# Patient Record
Sex: Female | Born: 1935 | Race: White | Hispanic: No | Marital: Married | Smoking: Former smoker
Health system: Southern US, Community
[De-identification: ages and names within clinical notes are randomized; demographics above are authoritative.]

## PROBLEM LIST (undated history)

## (undated) DIAGNOSIS — M1990 Unspecified osteoarthritis, unspecified site: Secondary | ICD-10-CM

## (undated) DIAGNOSIS — I10 Essential (primary) hypertension: Secondary | ICD-10-CM

## (undated) DIAGNOSIS — K639 Disease of intestine, unspecified: Secondary | ICD-10-CM

## (undated) DIAGNOSIS — E039 Hypothyroidism, unspecified: Secondary | ICD-10-CM

## (undated) DIAGNOSIS — M47816 Spondylosis without myelopathy or radiculopathy, lumbar region: Secondary | ICD-10-CM

## (undated) DIAGNOSIS — Z860100 Personal history of colon polyps, unspecified: Secondary | ICD-10-CM

## (undated) DIAGNOSIS — J309 Allergic rhinitis, unspecified: Secondary | ICD-10-CM

## (undated) DIAGNOSIS — M5412 Radiculopathy, cervical region: Secondary | ICD-10-CM

## (undated) DIAGNOSIS — M1712 Unilateral primary osteoarthritis, left knee: Secondary | ICD-10-CM

## (undated) DIAGNOSIS — F329 Major depressive disorder, single episode, unspecified: Secondary | ICD-10-CM

## (undated) DIAGNOSIS — M810 Age-related osteoporosis without current pathological fracture: Secondary | ICD-10-CM

## (undated) DIAGNOSIS — K219 Gastro-esophageal reflux disease without esophagitis: Secondary | ICD-10-CM

## (undated) DIAGNOSIS — K589 Irritable bowel syndrome without diarrhea: Secondary | ICD-10-CM

## (undated) DIAGNOSIS — G4700 Insomnia, unspecified: Secondary | ICD-10-CM

## (undated) DIAGNOSIS — K6389 Other specified diseases of intestine: Secondary | ICD-10-CM

## (undated) DIAGNOSIS — M1711 Unilateral primary osteoarthritis, right knee: Secondary | ICD-10-CM

## (undated) DIAGNOSIS — F411 Generalized anxiety disorder: Secondary | ICD-10-CM

## (undated) DIAGNOSIS — F419 Anxiety disorder, unspecified: Secondary | ICD-10-CM

## (undated) DIAGNOSIS — J329 Chronic sinusitis, unspecified: Secondary | ICD-10-CM

## (undated) DIAGNOSIS — F32A Depression, unspecified: Secondary | ICD-10-CM

## (undated) DIAGNOSIS — G43909 Migraine, unspecified, not intractable, without status migrainosus: Secondary | ICD-10-CM

## (undated) DIAGNOSIS — E785 Hyperlipidemia, unspecified: Secondary | ICD-10-CM

## (undated) DIAGNOSIS — Z820 Family history of epilepsy and other diseases of the nervous system: Secondary | ICD-10-CM

## (undated) DIAGNOSIS — K279 Peptic ulcer, site unspecified, unspecified as acute or chronic, without hemorrhage or perforation: Secondary | ICD-10-CM

## (undated) DIAGNOSIS — G5600 Carpal tunnel syndrome, unspecified upper limb: Secondary | ICD-10-CM

## (undated) HISTORY — DX: Unspecified osteoarthritis, unspecified site: M19.90

## (undated) HISTORY — DX: Migraine, unspecified, not intractable, without status migrainosus: G43.909

## (undated) HISTORY — DX: Unilateral primary osteoarthritis, left knee: M17.12

## (undated) HISTORY — DX: Anxiety disorder, unspecified: F41.9

## (undated) HISTORY — PX: APPENDECTOMY: SHX54

## (undated) HISTORY — DX: Personal history of colon polyps, unspecified: Z86.0100

## (undated) HISTORY — DX: Hyperlipidemia, unspecified: E78.5

## (undated) HISTORY — DX: Peptic ulcer, site unspecified, unspecified as acute or chronic, without hemorrhage or perforation: K27.9

## (undated) HISTORY — DX: Radiculopathy, cervical region: M54.12

## (undated) HISTORY — DX: Irritable bowel syndrome, unspecified: K58.9

## (undated) HISTORY — DX: Other specified diseases of intestine: K63.89

## (undated) HISTORY — DX: Generalized anxiety disorder: F41.1

## (undated) HISTORY — DX: Essential (primary) hypertension: I10

## (undated) HISTORY — PX: BREAST BIOPSY: SHX20

## (undated) HISTORY — DX: Family history of epilepsy and other diseases of the nervous system: Z82.0

## (undated) HISTORY — DX: Hypothyroidism, unspecified: E03.9

## (undated) HISTORY — DX: Disease of intestine, unspecified: K63.9

## (undated) HISTORY — DX: Gastro-esophageal reflux disease without esophagitis: K21.9

## (undated) HISTORY — PX: THYROIDECTOMY: SHX17

## (undated) HISTORY — DX: Carpal tunnel syndrome, unspecified upper limb: G56.00

## (undated) HISTORY — DX: Unilateral primary osteoarthritis, right knee: M17.11

## (undated) HISTORY — DX: Depression, unspecified: F32.A

## (undated) HISTORY — DX: Spondylosis without myelopathy or radiculopathy, lumbar region: M47.816

## (undated) HISTORY — PX: ABDOMINAL HYSTERECTOMY: SHX81

## (undated) HISTORY — DX: Allergic rhinitis, unspecified: J30.9

## (undated) HISTORY — DX: Chronic sinusitis, unspecified: J32.9

## (undated) HISTORY — DX: Insomnia, unspecified: G47.00

## (undated) HISTORY — DX: Personal history of colonic polyps: Z86.010

## (undated) HISTORY — DX: Irritable bowel syndrome without diarrhea: K58.9

## (undated) HISTORY — PX: OTHER SURGICAL HISTORY: SHX169

## (undated) HISTORY — PX: TONSILLECTOMY: SUR1361

## (undated) HISTORY — DX: Major depressive disorder, single episode, unspecified: F32.9

## (undated) HISTORY — DX: Age-related osteoporosis without current pathological fracture: M81.0

---

## 1988-03-24 HISTORY — PX: BACK SURGERY: SHX140

## 1998-01-04 ENCOUNTER — Other Ambulatory Visit: Admission: RE | Admit: 1998-01-04 | Discharge: 1998-01-04 | Payer: Self-pay | Admitting: Gynecology

## 1998-06-21 ENCOUNTER — Ambulatory Visit (HOSPITAL_COMMUNITY): Admission: RE | Admit: 1998-06-21 | Discharge: 1998-06-21 | Payer: Self-pay | Admitting: Obstetrics & Gynecology

## 1999-01-17 ENCOUNTER — Other Ambulatory Visit: Admission: RE | Admit: 1999-01-17 | Discharge: 1999-01-17 | Payer: Self-pay | Admitting: Gynecology

## 1999-08-09 ENCOUNTER — Encounter: Payer: Self-pay | Admitting: Internal Medicine

## 1999-09-20 ENCOUNTER — Ambulatory Visit (HOSPITAL_COMMUNITY): Admission: RE | Admit: 1999-09-20 | Discharge: 1999-09-20 | Payer: Self-pay | Admitting: Gastroenterology

## 1999-10-10 ENCOUNTER — Encounter: Admission: RE | Admit: 1999-10-10 | Discharge: 1999-10-10 | Payer: Self-pay | Admitting: Otolaryngology

## 1999-10-10 ENCOUNTER — Encounter: Payer: Self-pay | Admitting: Otolaryngology

## 1999-12-28 ENCOUNTER — Encounter: Admission: RE | Admit: 1999-12-28 | Discharge: 1999-12-28 | Payer: Self-pay | Admitting: *Deleted

## 1999-12-28 ENCOUNTER — Encounter: Payer: Self-pay | Admitting: Allergy and Immunology

## 2000-01-23 ENCOUNTER — Other Ambulatory Visit: Admission: RE | Admit: 2000-01-23 | Discharge: 2000-01-23 | Payer: Self-pay | Admitting: Obstetrics and Gynecology

## 2001-02-05 ENCOUNTER — Other Ambulatory Visit: Admission: RE | Admit: 2001-02-05 | Discharge: 2001-02-05 | Payer: Self-pay | Admitting: Gynecology

## 2001-11-11 ENCOUNTER — Encounter: Admission: RE | Admit: 2001-11-11 | Discharge: 2001-11-11 | Payer: Self-pay | Admitting: Family Medicine

## 2001-11-11 ENCOUNTER — Encounter: Payer: Self-pay | Admitting: Family Medicine

## 2001-11-16 ENCOUNTER — Encounter: Payer: Self-pay | Admitting: Family Medicine

## 2001-11-16 ENCOUNTER — Encounter: Admission: RE | Admit: 2001-11-16 | Discharge: 2001-11-16 | Payer: Self-pay | Admitting: Family Medicine

## 2002-02-10 ENCOUNTER — Other Ambulatory Visit: Admission: RE | Admit: 2002-02-10 | Discharge: 2002-02-10 | Payer: Self-pay | Admitting: Gynecology

## 2003-02-13 ENCOUNTER — Other Ambulatory Visit: Admission: RE | Admit: 2003-02-13 | Discharge: 2003-02-13 | Payer: Self-pay | Admitting: Gynecology

## 2003-12-22 ENCOUNTER — Encounter: Admission: RE | Admit: 2003-12-22 | Discharge: 2003-12-22 | Payer: Self-pay | Admitting: Family Medicine

## 2004-01-03 ENCOUNTER — Encounter (INDEPENDENT_AMBULATORY_CARE_PROVIDER_SITE_OTHER): Payer: Self-pay | Admitting: Specialist

## 2004-01-03 ENCOUNTER — Ambulatory Visit (HOSPITAL_COMMUNITY): Admission: RE | Admit: 2004-01-03 | Discharge: 2004-01-03 | Payer: Self-pay | Admitting: Family Medicine

## 2004-02-14 ENCOUNTER — Other Ambulatory Visit: Admission: RE | Admit: 2004-02-14 | Discharge: 2004-02-14 | Payer: Self-pay | Admitting: Gynecology

## 2004-03-07 ENCOUNTER — Observation Stay (HOSPITAL_COMMUNITY): Admission: RE | Admit: 2004-03-07 | Discharge: 2004-03-08 | Payer: Self-pay | Admitting: Surgery

## 2004-03-07 ENCOUNTER — Encounter (INDEPENDENT_AMBULATORY_CARE_PROVIDER_SITE_OTHER): Payer: Self-pay | Admitting: Specialist

## 2004-03-12 ENCOUNTER — Ambulatory Visit (HOSPITAL_COMMUNITY): Admission: RE | Admit: 2004-03-12 | Discharge: 2004-03-12 | Payer: Self-pay | Admitting: Family Medicine

## 2004-03-24 ENCOUNTER — Encounter: Payer: Self-pay | Admitting: Internal Medicine

## 2004-03-24 LAB — CONVERTED CEMR LAB

## 2004-10-23 ENCOUNTER — Encounter: Payer: Self-pay | Admitting: Internal Medicine

## 2004-11-27 ENCOUNTER — Encounter: Payer: Self-pay | Admitting: Internal Medicine

## 2004-12-05 ENCOUNTER — Encounter: Payer: Self-pay | Admitting: Internal Medicine

## 2005-02-17 ENCOUNTER — Other Ambulatory Visit: Admission: RE | Admit: 2005-02-17 | Discharge: 2005-02-17 | Payer: Self-pay | Admitting: Gynecology

## 2005-03-05 ENCOUNTER — Encounter: Payer: Self-pay | Admitting: Internal Medicine

## 2005-03-06 ENCOUNTER — Encounter: Admission: RE | Admit: 2005-03-06 | Discharge: 2005-03-06 | Payer: Self-pay | Admitting: Gastroenterology

## 2005-03-06 ENCOUNTER — Encounter: Payer: Self-pay | Admitting: Internal Medicine

## 2006-01-20 ENCOUNTER — Ambulatory Visit: Payer: Self-pay | Admitting: Internal Medicine

## 2006-01-20 LAB — CONVERTED CEMR LAB
ALT: 19 units/L (ref 0–40)
Albumin: 3.8 g/dL (ref 3.5–5.2)
Alkaline Phosphatase: 68 units/L (ref 39–117)
BUN: 17 mg/dL (ref 6–23)
Basophils Absolute: 0.1 10*3/uL (ref 0.0–0.1)
CO2: 27 meq/L (ref 19–32)
Cholesterol: 181 mg/dL (ref 0–200)
Crystals: NEGATIVE
Epithelial cells, urine: NEGATIVE /lpf
Free T4: 1.2 ng/dL (ref 0.9–1.8)
Glomerular Filtration Rate, Af Am: 79 mL/min/{1.73_m2}
Glucose, Bld: 90 mg/dL (ref 70–99)
HDL: 54.6 mg/dL (ref >39.0)
Hemoglobin: 13.7 g/dL (ref 12.0–15.0)
Ketones, ur: NEGATIVE mg/dL
Monocytes Relative: 8.8 % (ref 3.0–11.0)
Mucus, UA: NEGATIVE
Platelets: 273 10*3/uL (ref 150–400)
Potassium: 4.1 meq/L (ref 3.5–5.1)
RBC / HPF: NONE SEEN
RDW: 12.8 % (ref 11.5–14.6)
Total Bilirubin: 0.5 mg/dL (ref 0.3–1.2)
Total Protein, Urine: NEGATIVE mg/dL
Total Protein: 7.3 g/dL (ref 6.0–8.3)
WBC: 6.8 10*3/uL (ref 4.5–10.5)

## 2006-02-06 ENCOUNTER — Emergency Department (HOSPITAL_COMMUNITY): Admission: EM | Admit: 2006-02-06 | Discharge: 2006-02-06 | Payer: Self-pay | Admitting: Family Medicine

## 2006-02-19 ENCOUNTER — Other Ambulatory Visit: Admission: RE | Admit: 2006-02-19 | Discharge: 2006-02-19 | Payer: Self-pay | Admitting: Gynecology

## 2006-02-21 LAB — HM COLONOSCOPY

## 2006-04-01 ENCOUNTER — Ambulatory Visit: Payer: Self-pay | Admitting: Internal Medicine

## 2006-06-09 ENCOUNTER — Ambulatory Visit: Payer: Self-pay | Admitting: Internal Medicine

## 2006-12-03 ENCOUNTER — Encounter: Payer: Self-pay | Admitting: Internal Medicine

## 2006-12-03 DIAGNOSIS — Z860100 Personal history of colon polyps, unspecified: Secondary | ICD-10-CM | POA: Insufficient documentation

## 2006-12-03 DIAGNOSIS — E039 Hypothyroidism, unspecified: Secondary | ICD-10-CM | POA: Insufficient documentation

## 2006-12-03 DIAGNOSIS — K279 Peptic ulcer, site unspecified, unspecified as acute or chronic, without hemorrhage or perforation: Secondary | ICD-10-CM | POA: Insufficient documentation

## 2006-12-03 DIAGNOSIS — J309 Allergic rhinitis, unspecified: Secondary | ICD-10-CM | POA: Insufficient documentation

## 2006-12-03 DIAGNOSIS — E785 Hyperlipidemia, unspecified: Secondary | ICD-10-CM

## 2006-12-03 DIAGNOSIS — G43909 Migraine, unspecified, not intractable, without status migrainosus: Secondary | ICD-10-CM

## 2006-12-03 DIAGNOSIS — M1990 Unspecified osteoarthritis, unspecified site: Secondary | ICD-10-CM | POA: Insufficient documentation

## 2006-12-03 DIAGNOSIS — I10 Essential (primary) hypertension: Secondary | ICD-10-CM | POA: Insufficient documentation

## 2006-12-03 HISTORY — DX: Peptic ulcer, site unspecified, unspecified as acute or chronic, without hemorrhage or perforation: K27.9

## 2006-12-03 HISTORY — DX: Personal history of colonic polyps: Z86.010

## 2006-12-03 HISTORY — DX: Allergic rhinitis, unspecified: J30.9

## 2006-12-03 HISTORY — DX: Hyperlipidemia, unspecified: E78.5

## 2006-12-03 HISTORY — DX: Unspecified osteoarthritis, unspecified site: M19.90

## 2006-12-03 HISTORY — DX: Personal history of colon polyps, unspecified: Z86.0100

## 2006-12-03 HISTORY — DX: Migraine, unspecified, not intractable, without status migrainosus: G43.909

## 2006-12-03 HISTORY — DX: Hypothyroidism, unspecified: E03.9

## 2006-12-03 HISTORY — DX: Essential (primary) hypertension: I10

## 2007-02-03 ENCOUNTER — Encounter: Payer: Self-pay | Admitting: Internal Medicine

## 2007-02-22 ENCOUNTER — Other Ambulatory Visit: Admission: RE | Admit: 2007-02-22 | Discharge: 2007-02-22 | Payer: Self-pay | Admitting: Gynecology

## 2007-03-16 ENCOUNTER — Ambulatory Visit: Payer: Self-pay | Admitting: Internal Medicine

## 2007-03-16 DIAGNOSIS — K219 Gastro-esophageal reflux disease without esophagitis: Secondary | ICD-10-CM

## 2007-03-16 DIAGNOSIS — J019 Acute sinusitis, unspecified: Secondary | ICD-10-CM

## 2007-03-16 DIAGNOSIS — M810 Age-related osteoporosis without current pathological fracture: Secondary | ICD-10-CM | POA: Insufficient documentation

## 2007-03-16 HISTORY — DX: Gastro-esophageal reflux disease without esophagitis: K21.9

## 2007-03-31 ENCOUNTER — Encounter: Payer: Self-pay | Admitting: Internal Medicine

## 2007-04-12 ENCOUNTER — Encounter: Payer: Self-pay | Admitting: Internal Medicine

## 2007-05-27 ENCOUNTER — Telehealth (INDEPENDENT_AMBULATORY_CARE_PROVIDER_SITE_OTHER): Payer: Self-pay | Admitting: *Deleted

## 2007-05-31 ENCOUNTER — Encounter: Payer: Self-pay | Admitting: Internal Medicine

## 2007-06-02 ENCOUNTER — Telehealth (INDEPENDENT_AMBULATORY_CARE_PROVIDER_SITE_OTHER): Payer: Self-pay | Admitting: *Deleted

## 2007-06-03 ENCOUNTER — Ambulatory Visit: Payer: Self-pay | Admitting: Internal Medicine

## 2007-06-03 DIAGNOSIS — G5600 Carpal tunnel syndrome, unspecified upper limb: Secondary | ICD-10-CM

## 2007-06-03 HISTORY — DX: Carpal tunnel syndrome, unspecified upper limb: G56.00

## 2007-06-07 ENCOUNTER — Telehealth (INDEPENDENT_AMBULATORY_CARE_PROVIDER_SITE_OTHER): Payer: Self-pay | Admitting: *Deleted

## 2007-08-12 ENCOUNTER — Telehealth (INDEPENDENT_AMBULATORY_CARE_PROVIDER_SITE_OTHER): Payer: Self-pay | Admitting: *Deleted

## 2007-08-18 ENCOUNTER — Telehealth: Payer: Self-pay | Admitting: Internal Medicine

## 2007-08-19 ENCOUNTER — Ambulatory Visit: Payer: Self-pay | Admitting: Internal Medicine

## 2007-08-19 ENCOUNTER — Encounter (INDEPENDENT_AMBULATORY_CARE_PROVIDER_SITE_OTHER): Payer: Self-pay | Admitting: *Deleted

## 2007-08-19 DIAGNOSIS — R1084 Generalized abdominal pain: Secondary | ICD-10-CM

## 2007-09-15 ENCOUNTER — Ambulatory Visit: Payer: Self-pay | Admitting: Gastroenterology

## 2007-09-15 DIAGNOSIS — K589 Irritable bowel syndrome without diarrhea: Secondary | ICD-10-CM

## 2007-09-15 DIAGNOSIS — K625 Hemorrhage of anus and rectum: Secondary | ICD-10-CM

## 2007-09-15 DIAGNOSIS — K921 Melena: Secondary | ICD-10-CM

## 2007-09-15 HISTORY — DX: Irritable bowel syndrome, unspecified: K58.9

## 2007-09-30 ENCOUNTER — Telehealth: Payer: Self-pay | Admitting: Internal Medicine

## 2007-10-12 ENCOUNTER — Ambulatory Visit: Payer: Self-pay | Admitting: Gastroenterology

## 2007-10-12 ENCOUNTER — Encounter: Payer: Self-pay | Admitting: Gastroenterology

## 2007-10-14 ENCOUNTER — Encounter: Payer: Self-pay | Admitting: Gastroenterology

## 2007-10-15 ENCOUNTER — Telehealth (INDEPENDENT_AMBULATORY_CARE_PROVIDER_SITE_OTHER): Payer: Self-pay | Admitting: *Deleted

## 2007-11-09 ENCOUNTER — Encounter: Admission: RE | Admit: 2007-11-09 | Discharge: 2007-11-09 | Payer: Self-pay | Admitting: *Deleted

## 2007-11-10 ENCOUNTER — Ambulatory Visit (HOSPITAL_BASED_OUTPATIENT_CLINIC_OR_DEPARTMENT_OTHER): Admission: RE | Admit: 2007-11-10 | Discharge: 2007-11-10 | Payer: Self-pay | Admitting: *Deleted

## 2007-11-10 ENCOUNTER — Encounter (INDEPENDENT_AMBULATORY_CARE_PROVIDER_SITE_OTHER): Payer: Self-pay | Admitting: *Deleted

## 2007-12-02 ENCOUNTER — Ambulatory Visit: Payer: Self-pay | Admitting: Internal Medicine

## 2007-12-22 ENCOUNTER — Ambulatory Visit: Payer: Self-pay | Admitting: Internal Medicine

## 2008-01-17 ENCOUNTER — Telehealth (INDEPENDENT_AMBULATORY_CARE_PROVIDER_SITE_OTHER): Payer: Self-pay | Admitting: *Deleted

## 2008-02-02 ENCOUNTER — Encounter: Payer: Self-pay | Admitting: Internal Medicine

## 2008-02-22 LAB — CONVERTED CEMR LAB: Pap Smear: NORMAL

## 2008-02-24 ENCOUNTER — Encounter: Payer: Self-pay | Admitting: Gynecology

## 2008-02-24 ENCOUNTER — Ambulatory Visit: Payer: Self-pay | Admitting: Gynecology

## 2008-02-24 ENCOUNTER — Other Ambulatory Visit: Admission: RE | Admit: 2008-02-24 | Discharge: 2008-02-24 | Payer: Self-pay | Admitting: Gynecology

## 2008-03-06 ENCOUNTER — Ambulatory Visit: Payer: Self-pay | Admitting: Internal Medicine

## 2008-05-31 ENCOUNTER — Ambulatory Visit: Payer: Self-pay | Admitting: Internal Medicine

## 2008-05-31 DIAGNOSIS — R10811 Right upper quadrant abdominal tenderness: Secondary | ICD-10-CM

## 2008-05-31 DIAGNOSIS — F411 Generalized anxiety disorder: Secondary | ICD-10-CM

## 2008-05-31 DIAGNOSIS — F3289 Other specified depressive episodes: Secondary | ICD-10-CM

## 2008-05-31 DIAGNOSIS — F329 Major depressive disorder, single episode, unspecified: Secondary | ICD-10-CM

## 2008-05-31 DIAGNOSIS — R079 Chest pain, unspecified: Secondary | ICD-10-CM

## 2008-05-31 HISTORY — DX: Generalized anxiety disorder: F41.1

## 2008-05-31 HISTORY — DX: Major depressive disorder, single episode, unspecified: F32.9

## 2008-05-31 HISTORY — DX: Other specified depressive episodes: F32.89

## 2008-06-01 LAB — CONVERTED CEMR LAB
ALT: 22 units/L (ref 0–35)
AST: 26 units/L (ref 0–37)
Alkaline Phosphatase: 67 units/L (ref 39–117)
BUN: 20 mg/dL (ref 6–23)
Bacteria, UA: NEGATIVE
Basophils Relative: 0.8 % (ref 0.0–3.0)
CO2: 28 meq/L (ref 19–32)
Chloride: 105 meq/L (ref 96–112)
Creatinine, Ser: 0.9 mg/dL (ref 0.4–1.2)
Direct LDL: 121.8 mg/dL
Eosinophils Relative: 2.2 % (ref 0.0–5.0)
Lymphocytes Relative: 33.6 % (ref 12.0–46.0)
Mucus, UA: NEGATIVE
Neutrophils Relative %: 53.9 % (ref 43.0–77.0)
Nitrite: NEGATIVE
Potassium: 4.5 meq/L (ref 3.5–5.1)
RBC: 4.42 M/uL (ref 3.87–5.11)
Specific Gravity, Urine: 1.01 (ref 1.000–1.035)
Squamous Epithelial / LPF: NEGATIVE /lpf
Total Bilirubin: 0.8 mg/dL (ref 0.3–1.2)
Total CHOL/HDL Ratio: 4.3
Urobilinogen, UA: 0.2 (ref 0.0–1.0)
VLDL: 33 mg/dL (ref 0–40)
WBC: 6.5 10*3/uL (ref 4.5–10.5)

## 2008-06-05 ENCOUNTER — Telehealth (INDEPENDENT_AMBULATORY_CARE_PROVIDER_SITE_OTHER): Payer: Self-pay | Admitting: *Deleted

## 2008-06-07 ENCOUNTER — Encounter: Payer: Self-pay | Admitting: Internal Medicine

## 2008-06-07 ENCOUNTER — Encounter: Admission: RE | Admit: 2008-06-07 | Discharge: 2008-06-07 | Payer: Self-pay | Admitting: Internal Medicine

## 2008-06-07 DIAGNOSIS — D376 Neoplasm of uncertain behavior of liver, gallbladder and bile ducts: Secondary | ICD-10-CM

## 2008-06-09 ENCOUNTER — Encounter: Payer: Self-pay | Admitting: Internal Medicine

## 2008-06-11 ENCOUNTER — Encounter: Admission: RE | Admit: 2008-06-11 | Discharge: 2008-06-11 | Payer: Self-pay | Admitting: Internal Medicine

## 2008-06-30 ENCOUNTER — Telehealth (INDEPENDENT_AMBULATORY_CARE_PROVIDER_SITE_OTHER): Payer: Self-pay | Admitting: *Deleted

## 2008-08-07 ENCOUNTER — Ambulatory Visit: Payer: Self-pay | Admitting: Internal Medicine

## 2008-09-12 ENCOUNTER — Telehealth (INDEPENDENT_AMBULATORY_CARE_PROVIDER_SITE_OTHER): Payer: Self-pay | Admitting: *Deleted

## 2008-09-26 ENCOUNTER — Encounter: Payer: Self-pay | Admitting: Internal Medicine

## 2008-10-23 ENCOUNTER — Telehealth (INDEPENDENT_AMBULATORY_CARE_PROVIDER_SITE_OTHER): Payer: Self-pay | Admitting: *Deleted

## 2008-10-24 ENCOUNTER — Ambulatory Visit: Payer: Self-pay | Admitting: Internal Medicine

## 2008-10-24 DIAGNOSIS — N644 Mastodynia: Secondary | ICD-10-CM

## 2008-11-10 ENCOUNTER — Telehealth (INDEPENDENT_AMBULATORY_CARE_PROVIDER_SITE_OTHER): Payer: Self-pay | Admitting: *Deleted

## 2008-11-13 ENCOUNTER — Ambulatory Visit: Payer: Self-pay | Admitting: Internal Medicine

## 2008-11-13 DIAGNOSIS — IMO0002 Reserved for concepts with insufficient information to code with codable children: Secondary | ICD-10-CM | POA: Insufficient documentation

## 2008-11-13 DIAGNOSIS — R21 Rash and other nonspecific skin eruption: Secondary | ICD-10-CM

## 2008-11-13 DIAGNOSIS — M75100 Unspecified rotator cuff tear or rupture of unspecified shoulder, not specified as traumatic: Secondary | ICD-10-CM

## 2008-11-13 DIAGNOSIS — M542 Cervicalgia: Secondary | ICD-10-CM | POA: Insufficient documentation

## 2008-11-20 ENCOUNTER — Encounter: Payer: Self-pay | Admitting: Internal Medicine

## 2008-11-20 LAB — HM MAMMOGRAPHY

## 2008-11-24 ENCOUNTER — Telehealth: Payer: Self-pay | Admitting: Internal Medicine

## 2008-12-20 ENCOUNTER — Ambulatory Visit: Payer: Self-pay | Admitting: Internal Medicine

## 2009-02-26 ENCOUNTER — Other Ambulatory Visit: Admission: RE | Admit: 2009-02-26 | Discharge: 2009-02-26 | Payer: Self-pay | Admitting: Gynecology

## 2009-02-26 ENCOUNTER — Ambulatory Visit: Payer: Self-pay | Admitting: Gynecology

## 2009-03-14 ENCOUNTER — Telehealth: Payer: Self-pay | Admitting: Internal Medicine

## 2009-03-26 ENCOUNTER — Telehealth: Payer: Self-pay | Admitting: Internal Medicine

## 2009-03-27 ENCOUNTER — Ambulatory Visit: Payer: Self-pay | Admitting: Internal Medicine

## 2009-03-29 ENCOUNTER — Telehealth: Payer: Self-pay | Admitting: Internal Medicine

## 2009-04-05 ENCOUNTER — Telehealth: Payer: Self-pay | Admitting: Internal Medicine

## 2009-04-10 ENCOUNTER — Ambulatory Visit: Payer: Self-pay | Admitting: Internal Medicine

## 2009-05-23 ENCOUNTER — Ambulatory Visit: Payer: Self-pay | Admitting: Internal Medicine

## 2009-05-23 LAB — CONVERTED CEMR LAB
Albumin: 3.7 g/dL (ref 3.5–5.2)
Alkaline Phosphatase: 86 units/L (ref 39–117)
Basophils Absolute: 0 10*3/uL (ref 0.0–0.1)
Basophils Relative: 0.6 % (ref 0.0–3.0)
Bilirubin Urine: NEGATIVE
CO2: 27 meq/L (ref 19–32)
Calcium: 9 mg/dL (ref 8.4–10.5)
Chloride: 102 meq/L (ref 96–112)
Eosinophils Absolute: 0.1 10*3/uL (ref 0.0–0.7)
Glucose, Bld: 92 mg/dL (ref 70–99)
HCT: 40.3 % (ref 36.0–46.0)
HDL: 56.3 mg/dL (ref >39.00)
Hemoglobin: 13.1 g/dL (ref 12.0–15.0)
Ketones, ur: NEGATIVE mg/dL
Leukocytes, UA: NEGATIVE
Lymphs Abs: 2 10*3/uL (ref 0.7–4.0)
MCHC: 32.6 g/dL (ref 30.0–36.0)
MCV: 87.7 fL (ref 78.0–100.0)
Monocytes Absolute: 0.6 10*3/uL (ref 0.1–1.0)
Neutro Abs: 3.6 10*3/uL (ref 1.4–7.7)
Nitrite: NEGATIVE
RBC: 4.6 M/uL (ref 3.87–5.11)
RDW: 13.1 % (ref 11.5–14.6)
Sodium: 139 meq/L (ref 135–145)
Specific Gravity, Urine: 1.015 (ref 1.000–1.030)
TSH: 0.5 microintl units/mL (ref 0.35–5.50)
Total CHOL/HDL Ratio: 3
Total Protein, Urine: NEGATIVE mg/dL
Total Protein: 7 g/dL (ref 6.0–8.3)
Triglycerides: 147 mg/dL (ref 0.0–149.0)
pH: 6 (ref 5.0–8.0)

## 2009-05-25 ENCOUNTER — Ambulatory Visit: Payer: Self-pay | Admitting: Internal Medicine

## 2009-09-05 ENCOUNTER — Telehealth: Payer: Self-pay | Admitting: Internal Medicine

## 2009-10-02 ENCOUNTER — Ambulatory Visit: Payer: Self-pay | Admitting: Internal Medicine

## 2009-10-26 ENCOUNTER — Ambulatory Visit: Payer: Self-pay | Admitting: Internal Medicine

## 2009-11-22 ENCOUNTER — Encounter: Payer: Self-pay | Admitting: Internal Medicine

## 2009-12-03 ENCOUNTER — Emergency Department (HOSPITAL_COMMUNITY): Admission: EM | Admit: 2009-12-03 | Discharge: 2009-12-03 | Payer: Self-pay | Admitting: Emergency Medicine

## 2009-12-18 ENCOUNTER — Encounter: Payer: Self-pay | Admitting: Internal Medicine

## 2009-12-18 ENCOUNTER — Ambulatory Visit: Payer: Self-pay | Admitting: Internal Medicine

## 2009-12-18 DIAGNOSIS — M47817 Spondylosis without myelopathy or radiculopathy, lumbosacral region: Secondary | ICD-10-CM

## 2009-12-18 DIAGNOSIS — M47812 Spondylosis without myelopathy or radiculopathy, cervical region: Secondary | ICD-10-CM | POA: Insufficient documentation

## 2009-12-18 DIAGNOSIS — M25469 Effusion, unspecified knee: Secondary | ICD-10-CM

## 2009-12-27 ENCOUNTER — Telehealth (INDEPENDENT_AMBULATORY_CARE_PROVIDER_SITE_OTHER): Payer: Self-pay | Admitting: Radiology

## 2009-12-31 ENCOUNTER — Encounter: Payer: Self-pay | Admitting: Internal Medicine

## 2009-12-31 ENCOUNTER — Encounter (HOSPITAL_COMMUNITY)
Admission: RE | Admit: 2009-12-31 | Discharge: 2010-01-18 | Payer: Self-pay | Source: Home / Self Care | Admitting: Internal Medicine

## 2009-12-31 ENCOUNTER — Ambulatory Visit: Payer: Self-pay

## 2009-12-31 ENCOUNTER — Ambulatory Visit: Payer: Self-pay | Admitting: Internal Medicine

## 2010-01-21 ENCOUNTER — Ambulatory Visit: Payer: Self-pay | Admitting: Gynecology

## 2010-01-24 ENCOUNTER — Ambulatory Visit: Payer: Self-pay | Admitting: Internal Medicine

## 2010-01-25 ENCOUNTER — Telehealth (INDEPENDENT_AMBULATORY_CARE_PROVIDER_SITE_OTHER): Payer: Self-pay | Admitting: *Deleted

## 2010-02-05 ENCOUNTER — Encounter: Payer: Self-pay | Admitting: Internal Medicine

## 2010-02-06 ENCOUNTER — Telehealth: Payer: Self-pay | Admitting: Internal Medicine

## 2010-02-21 ENCOUNTER — Telehealth: Payer: Self-pay | Admitting: Internal Medicine

## 2010-02-21 ENCOUNTER — Encounter: Payer: Self-pay | Admitting: Internal Medicine

## 2010-02-27 ENCOUNTER — Ambulatory Visit: Payer: Self-pay | Admitting: Gynecology

## 2010-02-27 ENCOUNTER — Other Ambulatory Visit
Admission: RE | Admit: 2010-02-27 | Discharge: 2010-02-27 | Payer: Self-pay | Source: Home / Self Care | Admitting: Gynecology

## 2010-03-01 ENCOUNTER — Encounter: Payer: Self-pay | Admitting: Internal Medicine

## 2010-03-04 ENCOUNTER — Telehealth: Payer: Self-pay | Admitting: Internal Medicine

## 2010-03-06 ENCOUNTER — Ambulatory Visit: Payer: Self-pay | Admitting: Internal Medicine

## 2010-03-06 DIAGNOSIS — R062 Wheezing: Secondary | ICD-10-CM

## 2010-03-07 ENCOUNTER — Encounter: Payer: Self-pay | Admitting: Internal Medicine

## 2010-04-13 ENCOUNTER — Encounter: Payer: Self-pay | Admitting: Family Medicine

## 2010-04-21 LAB — CONVERTED CEMR LAB
AST: 23 units/L (ref 0–37)
Bilirubin, Direct: 0.1 mg/dL (ref 0.0–0.3)
Chloride: 106 meq/L (ref 96–112)
Direct LDL: 129.7 mg/dL
Eosinophils Absolute: 0.1 10*3/uL (ref 0.0–0.6)
Eosinophils Relative: 2.6 % (ref 0.0–5.0)
GFR calc non Af Amer: 58 mL/min
Glucose, Bld: 100 mg/dL — ABNORMAL HIGH (ref 70–99)
HCT: 41.1 % (ref 36.0–46.0)
Leukocytes, UA: NEGATIVE
Lymphocytes Relative: 35.8 % (ref 12.0–46.0)
MCV: 87.5 fL (ref 78.0–100.0)
Neutro Abs: 3.1 10*3/uL (ref 1.4–7.7)
Neutrophils Relative %: 52.7 % (ref 43.0–77.0)
Nitrite: NEGATIVE
RBC: 4.7 M/uL (ref 3.87–5.11)
Sodium: 141 meq/L (ref 135–145)
TSH: 0.71 microintl units/mL (ref 0.35–5.50)
Total Protein: 7.5 g/dL (ref 6.0–8.3)
Urobilinogen, UA: 0.2 (ref 0.0–1.0)
Vit D, 1,25-Dihydroxy: 32 (ref 30–89)
WBC: 5.7 10*3/uL (ref 4.5–10.5)

## 2010-04-23 NOTE — Assessment & Plan Note (Signed)
Summary: f/u appt/cd   Vital Signs:  Patient profile:   77 year old female Height:      57 inches Weight:      132 pounds BMI:     28.67 O2 Sat:      98 % on Room air Temp:     97.1 degrees F oral Pulse rate:   76 / minute BP sitting:   124 / 90  (left arm) Cuff size:   regular  Vitals Entered ByZella Ball Ewing (March 27, 2009 10:12 AM)  O2 Flow:  Room air CC: yearly followup/Re   Primary Care Provider:  Oliver Barre, MD  CC:  yearly followup/Re.  History of Present Illness: overall doing well, no complaints, has been gagining wt with les sexcercise and would like thyroid checked;  denies speific other hyper or hypo thyroid symptoms,  Pt denies CP, sob, doe, wheezing, orthopnea, pnd, worsening LE edema, palps, dizziness or syncope  Pt denies new neuro symptoms such as headache, facial or extremity weakness   Also with 1 - 2 months increased nasal allergy symtpoms with clear d/c and sneezing;  no eye irritation.  Has not been taking the allegra and flonase lately.  No ST , fever, cough or wheezing.   Not taking the sertraline  -? why  Problems Prior to Update: 1)  Neck Pain  (ICD-723.1) 2)  Subacromial Bursitis, Left  (ICD-726.19) 3)  Rash-nonvesicular  (ICD-782.1) 4)  Breast Pain, Right  (ICD-611.71) 5)  Chest Pain  (ICD-786.50) 6)  Liver Mass  (ICD-235.3) 7)  Abdominal Tenderness, Right Upper Quadrant  (ICD-789.61) 8)  Depression  (ICD-311) 9)  Chest Pain  (ICD-786.50) 10)  Preventive Health Care  (ICD-V70.0) 11)  Anxiety  (ICD-300.00) 12)  Irritable Bowel Syndrome  (ICD-564.1) 13)  Rectal Bleeding  (ICD-569.3) 14)  Blood in Stool  (ICD-578.1) 15)  Abdominal Pain, Generalized  (ICD-789.07) 16)  Carpal Tunnel Syndrome, Bilateral  (ICD-354.0) 17)  Preventive Health Care  (ICD-V70.0) 18)  Osteoporosis  (ICD-733.00) 19)  Gerd  (ICD-530.81) 20)  Sinusitis- Acute-nos  (ICD-461.9) 21)  Hypothyroidism  (ICD-244.9) 22)  Peptic Ulcer Disease  (ICD-533.90) 23)  Osteoarthritis   (ICD-715.90) 24)  Hypertension  (ICD-401.9) 25)  Hyperlipidemia  (ICD-272.4) 26)  Colonic Polyps, Hx of  (ICD-V12.72) 27)  Allergic Rhinitis  (ICD-477.9) 28)  Migraine Headache  (ICD-346.90)  Medications Prior to Update: 1)  Levothyroxine Sodium 75 Mcg  Tabs (Levothyroxine Sodium) .Marland Kitchen.. 1 By Mouth Once Daily 2)  Lipitor 10 Mg  Tabs (Atorvastatin Calcium) .Marland Kitchen.. 1 By Mouth Once Daily 3)  Lisinopril 5 Mg  Tabs (Lisinopril) .Marland Kitchen.. 1 By Mouth Once Daily 4)  Hyoscyamine Sulfate Cr 0.375 Mg  Cp12 (Hyoscyamine Sulfate) .... As Needed 5)  Allegra 180 Mg  Tabs (Fexofenadine Hcl) .Marland Kitchen.. 1 By Mouth Once Daily 6)  Lansoprazole 30 Mg Cpdr (Lansoprazole) .Marland Kitchen.. 1 By Mouth Once Daily 7)  Cyclobenzaprine Hcl 10 Mg  Tabs (Cyclobenzaprine Hcl) .... 1/2 - 1 By Mouth Once Daily As Needed 8)  Calcitonin (Salmon) 200 Unit/act Soln (Calcitonin (Salmon)) .... Use Asd 1 Spray Once Daily Alternating Days 9)  Fluticasone Propionate 50 Mcg/act Susp (Fluticasone Propionate) .... 2 Spray/side Once Daily 10)  Alprazolam 0.5 Mg  Tabs (Alprazolam) .Marland Kitchen.. 1 By Mouth Q 8 Hrs Prn 11)  Zolpidem Tartrate 10 Mg  Tabs (Zolpidem Tartrate) .Marland Kitchen.. 1 By Mouth Once Daily As Needed 12)  Darvocet-N 100 100-650 Mg  Tabs (Propoxyphene N-Apap) .Marland Kitchen.. 1-2 Tabs As Needed  Qid As  Needed Pain 13)  Cephalexin 500 Mg Caps (Cephalexin) .Marland Kitchen.. 1 By Mouth Three Times A Day 14)  Sertraline Hcl 50 Mg Tabs (Sertraline Hcl) .Marland Kitchen.. 1 By Mouth Once Daily 15)  Prednisone 10 Mg Tabs (Prednisone) .... 3po Qd For 3days, Then 2po Qd For 3days, Then 1po Qd For 3days, Then Stop  Current Medications (verified): 1)  Levothyroxine Sodium 75 Mcg  Tabs (Levothyroxine Sodium) .Marland Kitchen.. 1 By Mouth Once Daily 2)  Lipitor 10 Mg  Tabs (Atorvastatin Calcium) .Marland Kitchen.. 1 By Mouth Once Daily 3)  Lisinopril 5 Mg  Tabs (Lisinopril) .Marland Kitchen.. 1 By Mouth Once Daily 4)  Hyoscyamine Sulfate Cr 0.375 Mg  Cp12 (Hyoscyamine Sulfate) .... As Needed 5)  Allegra 180 Mg  Tabs (Fexofenadine Hcl) .Marland Kitchen.. 1 By Mouth Once  Daily 6)  Lansoprazole 30 Mg Cpdr (Lansoprazole) .Marland Kitchen.. 1 By Mouth Once Daily 7)  Cyclobenzaprine Hcl 10 Mg  Tabs (Cyclobenzaprine Hcl) .... 1/2 - 1 By Mouth Once Daily As Needed 8)  Fluticasone Propionate 50 Mcg/act Susp (Fluticasone Propionate) .... 2 Spray/side Once Daily 9)  Alprazolam 0.5 Mg  Tabs (Alprazolam) .Marland Kitchen.. 1 By Mouth Q 8 Hrs Prn 10)  Zolpidem Tartrate 10 Mg  Tabs (Zolpidem Tartrate) .Marland Kitchen.. 1 By Mouth Once Daily As Needed  Allergies (verified): 1)  ! Tetracycline  Past History:  Past Medical History: Last updated: 05/31/2008 Migraine Headaches Allergic rhinitis Colonic polyps, hx of - adenoma - last 7/09 Hyperlipidemia Hypertension Osteoarthritis Peptic ulcer disease Hypothyroidism GERD knee djd bilat left > right Osteoporosis Ileocecal valve ulcer IBS Anxiety Depression  Past Surgical History: Last updated: 05/31/2008 Appendectomy- 1979 Hysterectomy- 1979 Thyroidectomy- 02/2004 Tonsillectomy-1937 Breat Biopsy-1970 Back Surgery-1990 s/p right hand surgury/trigger surgury - dr Loletta Parish  Social History: Last updated: 09/15/2007 Married; 1 boy, 2 girls Former Smoker Alcohol use-no Married  Risk Factors: Smoking Status: quit (03/16/2007)  Review of Systems       all otherwise negative per pt -  Physical Exam  General:  alert and overweight-appearing.   Head:  normocephalic and atraumatic.   Eyes:  vision grossly intact, pupils equal, and pupils round.   Ears:  R ear normal and L ear normal.   Nose:  no external deformity and no nasal discharge.   Mouth:  no gingival abnormalities and pharynx pink and moist.   Neck:  supple and no masses.   Lungs:  normal respiratory effort and normal breath sounds.   Heart:  normal rate and regular rhythm.   Extremities:  no edema, no erythema    Impression & Recommendations:  Problem # 1:  HYPERTENSION (ICD-401.9)  Her updated medication list for this problem includes:    Lisinopril 5 Mg Tabs  (Lisinopril) .Marland Kitchen... 1 by mouth once daily  Orders: EKG w/ Interpretation (93000)  BP today: 124/90 Prior BP: 102/62 (11/13/2008)  Labs Reviewed: K+: 4.5 (05/31/2008) Creat: : 0.9 (05/31/2008)   Chol: 212 (05/31/2008)   HDL: 49.5 (05/31/2008)   LDL: DEL (05/31/2008)   TG: 165 (05/31/2008) stable overall by hx and exam, ok to continue meds/tx as is   Problem # 2:  HYPOTHYROIDISM (ICD-244.9)  Her updated medication list for this problem includes:    Levothyroxine Sodium 75 Mcg Tabs (Levothyroxine sodium) .Marland Kitchen... 1 by mouth once daily pt requests TSH today, stable overall by hx and exam, ok to continue meds/tx as is   Orders: TLB-TSH (Thyroid Stimulating Hormone) (84443-TSH)  Problem # 3:  OSTEOPOROSIS (ICD-733.00)  The following medications were removed from the medication list:  Calcitonin (salmon) 200 Unit/act Soln (Calcitonin (salmon)) ..... Use asd 1 spray once daily alternating days pt now off meds at GYN tx of pt - will defer to this  Problem # 4:  ALLERGIC RHINITIS (ICD-477.9)  Her updated medication list for this problem includes:    Allegra 180 Mg Tabs (Fexofenadine hcl) .Marland Kitchen... 1 by mouth once daily    Fluticasone Propionate 50 Mcg/act Susp (Fluticasone propionate) .Marland Kitchen... 2 spray/side once daily urged compliance iwth meds  Complete Medication List: 1)  Levothyroxine Sodium 75 Mcg Tabs (Levothyroxine sodium) .Marland Kitchen.. 1 by mouth once daily 2)  Lipitor 10 Mg Tabs (Atorvastatin calcium) .Marland Kitchen.. 1 by mouth once daily 3)  Lisinopril 5 Mg Tabs (Lisinopril) .Marland Kitchen.. 1 by mouth once daily 4)  Hyoscyamine Sulfate Cr 0.375 Mg Cp12 (Hyoscyamine sulfate) .... As needed 5)  Allegra 180 Mg Tabs (Fexofenadine hcl) .Marland Kitchen.. 1 by mouth once daily 6)  Lansoprazole 30 Mg Cpdr (Lansoprazole) .Marland Kitchen.. 1 by mouth once daily 7)  Cyclobenzaprine Hcl 10 Mg Tabs (Cyclobenzaprine hcl) .... 1/2 - 1 by mouth once daily as needed 8)  Fluticasone Propionate 50 Mcg/act Susp (Fluticasone propionate) .... 2 spray/side once  daily 9)  Alprazolam 0.5 Mg Tabs (Alprazolam) .Marland Kitchen.. 1 by mouth q 8 hrs prn 10)  Zolpidem Tartrate 10 Mg Tabs (Zolpidem tartrate) .Marland Kitchen.. 1 by mouth once daily as needed  Patient Instructions: 1)  Please take all new medications as prescribed - the allergy medications 2)  Continue all previous medications as before this visit  3)  Please go to the Lab in the basement for your blood  tests today 4)  Please schedule a follow-up appointment in march 2011 with CPX labs and : 5)  vit d  v70.0 Prescriptions: FLUTICASONE PROPIONATE 50 MCG/ACT SUSP (FLUTICASONE PROPIONATE) 2 spray/side once daily  #3 x 3   Entered and Authorized by:   Corwin Levins MD   Signed by:   Corwin Levins MD on 03/27/2009   Method used:   Print then Give to Patient   RxID:   [id number] ALLEGRA 180 MG  TABS (FEXOFENADINE HCL) 1 by mouth once daily  #90 x 3   Entered and Authorized by:   Corwin Levins MD   Signed by:   Corwin Levins MD on 03/27/2009   Method used:   Print then Give to Patient   RxID:   [id number]

## 2010-04-23 NOTE — Miscellaneous (Signed)
Summary: Appointment Canceled  Appointment status changed to canceled by [employer] on 12/20/2009 8:26 AM.  Cancellation Comments --------------------- crs/dx:chest pain/wt:134/ins:[facility]/Dr Jonny Ruiz  Appointment Information ----------------------- Appt Type:  CARDIOLOGY NUCLEAR TESTING      Date:  Thursday, December 27, 2009      Time:  8:00 AM for 15 min   Urgency:  Routine   Made By:  Pearson Grippe  To Visit:  [id number]    Reason:  crs/dx:chest pain/wt:134/ins:[facility]/Dr john  Appt Comments ------------- -- 12/20/09 8:26: ([facility]) CANCELED -- crs/dx:chest pain/wt:134/ins:[facility]/Dr john -- 12/19/09 16:35: ([facility]) BOOKED -- Routine CARDIOLOGY NUCLEAR TESTING at 12/27/2009 8:00 AM for 15 min crs/dx:chest pain/wt:134/ins:[facility]/Dr Jonny Ruiz

## 2010-04-23 NOTE — Progress Notes (Signed)
  Phone Note Refill Request  on March 26, 2009 3:58 PM  Refills Requested: Medication #1:  CYCLOBENZAPRINE HCL 10 MG  TABS 1/2 - 1 by mouth once daily as needed   Dosage confirmed as above?Dosage Confirmed   Notes: [facility] Initial call taken by: Scharlene Gloss,  March 26, 2009 3:59 PM    Prescriptions: CYCLOBENZAPRINE HCL 10 MG  TABS (CYCLOBENZAPRINE HCL) 1/2 - 1 by mouth once daily as needed  #90 x 1   Entered by:   Scharlene Gloss   Authorized by:   Corwin Levins MD   Signed by:   Scharlene Gloss on 03/26/2009   Method used:   Faxed to ...       [facility] (mail-order)       [id number]       [place], [place]  [id number]       Ph: [phone]       Fax: [phone]   RxID:   [id number]

## 2010-04-23 NOTE — Progress Notes (Signed)
  Phone Note Refill Request Message from:  Fax from Pharmacy on February 06, 2010 10:20 AM  Refills Requested: Medication #1:  LEVOTHYROXINE SODIUM 75 MCG  TABS 1 by mouth once daily   Dosage confirmed as above?Dosage Confirmed   Last Refilled: 03/14/2009   Notes: [facility]  Medication #2:  LISINOPRIL 5 MG  TABS 1 by mouth once daily   Dosage confirmed as above?Dosage Confirmed   Last Refilled: 03/04/2009   Notes: [facility]  Medication #3:  CYCLOBENZAPRINE HCL 10 MG  TABS 1/2 - 1 by mouth once daily as needed   Dosage confirmed as above?Dosage Confirmed   Last Refilled: 03/26/2009   Notes: [facility] Initial call taken by: Robin Ewing CMA (AAMA),  February 06, 2010 10:21 AM    Prescriptions: CYCLOBENZAPRINE HCL 10 MG  TABS (CYCLOBENZAPRINE HCL) 1/2 - 1 by mouth once daily as needed  #90 x 1   Entered by:   Scharlene Gloss CMA (AAMA)   Authorized by:   Corwin Levins MD   Signed by:   Scharlene Gloss CMA (AAMA) on 02/06/2010   Method used:   [facility] ...       [facility]             , [place]         Ph:        Fax: [phone]   RxID:   [id number] LISINOPRIL 5 MG  TABS (LISINOPRIL) 1 by mouth once daily  #90 Each x 3   Entered by:   Zella Ball Ewing CMA (AAMA)   Authorized by:   Corwin Levins MD   Signed by:   Scharlene Gloss CMA (AAMA) on 02/06/2010   Method used:   [facility]ed [facility] ...       [facility]             , [place]         Ph:        Fax: [phone]   RxID:   [id number] LEVOTHYROXINE SODIUM 75 MCG  TABS (LEVOTHYROXINE SODIUM) 1 by mouth once daily  #90 Each x 3   Entered by:   Scharlene Gloss CMA (AAMA)   Authorized by:   Corwin Levins MD   Signed by:   Scharlene Gloss CMA (AAMA) on 02/06/2010   Method used:   Faxed to ...       [facility]             , [place]         Ph:        Fax: [phone]   RxID:   [id number]   Appended Document:  LMOPT - dxa c/w osteoporosis -       1)  I will ask stacy to look into copay for prolia  which would be approp for her, if she agrees (can be started soon, or if she wants it could wait to discuss at next appt)  robin - to call pt to inform, and arrange any above - I will ask stacy to look into  Appended Document:  called pt informed of above information. She does want Misty Stanley to proceed with checking her copay and certainly will consider getting prolia. She does plan to do some reading on prolia in the meantime.

## 2010-04-23 NOTE — Progress Notes (Signed)
----   Converted from flag ---- ---- 01/24/2010 5:59 PM, Corwin Levins MD wrote: it has been shown that any dose of Vit E (even the 400's) can be harmful -  I would not cont to take any of this  ---- 01/24/2010 3:37 PM, Zella Ball Ewing CMA (AAMA) wrote: patient wanted to know should she continue to not take Vitamin E? ------------------------------  called pt. and informed of above information.

## 2010-04-23 NOTE — Progress Notes (Signed)
Summary: medication Refill  Phone Note Refill Request Message from:  Pharmacy on September 05, 2009 8:16 AM  Refills Requested: Medication #1:  ZOLPIDEM TARTRATE 10 MG  TABS 1 by mouth once daily as needed   Dosage confirmed as above?Dosage Confirmed   Last Refilled: 04/05/2009   Notes: [facility] fax#[phone] Initial call taken by: Scharlene Gloss,  September 05, 2009 8:17 AM  Follow-up for Phone Call        Rx faxed to pharmacy Follow-up by: Margaret Pyle, CMA,  September 05, 2009 8:42 AM    New/Updated Medications: ZOLPIDEM TARTRATE 10 MG  TABS (ZOLPIDEM TARTRATE) 1 by mouth once daily as needed Prescriptions: ZOLPIDEM TARTRATE 10 MG  TABS (ZOLPIDEM TARTRATE) 1 by mouth once daily as needed  #90 x 1   Entered and Authorized by:   Corwin Levins MD   Signed by:   Corwin Levins MD on 09/05/2009   Method used:   Print then Give to Patient   RxID:   [id number]  done hardcopy to [facility] side B - dahlia Corwin Levins MD  September 05, 2009 8:32 AM
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
## 2010-04-23 NOTE — Assessment & Plan Note (Signed)
Summary: SNEEZING   SORE THROAT  STC   Vital Signs:  Patient profile:   77 year old female Height:      59 inches Weight:      135 pounds BMI:     27.37 O2 Sat:      93 % on Room air Temp:     98.1 degrees F oral Pulse rate:   84 / minute BP sitting:   112 / 72  (left arm) Cuff size:   regular  Vitals Entered By: Zella Ball Ewing CMA Duncan Dull) (January 24, 2010 2:54 PM)  O2 Flow:  Room air CC: Sneezing, chest pain, sore throat and ear pain/RE   Primary Care Provider:  Oliver Barre, MD  CC:  Sneezing, chest pain, and sore throat and ear pain/RE.  History of Present Illness: here for acute - c/o 2-3 days onset facial pain, pressure, fever and greenish d/c, with slight ST but Pt denies CP, worsening sob, doe, wheezing, orthopnea, pnd, worsening LE edema, palps, dizziness or syncope  Pt denies new neuro symptoms such as headache, facial or extremity weakness  Denies worsening depressive symptoms, suicidal ideation, or panic., though cont's to have significant family stressor and seems to constantly worry over her husband's health as he doesnt seem to worry at all, as well as a daughter with children now divorced and depending on her for support with child care occasionaly, and marked financial stressors.  No recent wt loss, night sweats, loss of appetite or other constitutional symptoms   Problems Prior to Update: 1)  Spondylosis, Lumbar  (ICD-721.3) 2)  Spondylosis, Cervical  (ICD-721.0) 3)  Joint Effusion, Left Knee  (ICD-719.06) 4)  Chest Pain  (ICD-786.50) 5)  Neck Pain  (ICD-723.1) 6)  Sinusitis- Acute-nos  (ICD-461.9) 7)  Neck Pain  (ICD-723.1) 8)  Subacromial Bursitis, Left  (ICD-726.19) 9)  Rash-nonvesicular  (ICD-782.1) 10)  Breast Pain, Right  (ICD-611.71) 11)  Chest Pain  (ICD-786.50) 12)  Liver Mass  (ICD-235.3) 13)  Abdominal Tenderness, Right Upper Quadrant  (ICD-789.61) 14)  Depression  (ICD-311) 15)  Chest Pain  (ICD-786.50) 16)  Preventive Health Care  (ICD-V70.0) 17)   Anxiety  (ICD-300.00) 18)  Irritable Bowel Syndrome  (ICD-564.1) 19)  Rectal Bleeding  (ICD-569.3) 20)  Blood in Stool  (ICD-578.1) 21)  Abdominal Pain, Generalized  (ICD-789.07) 22)  Carpal Tunnel Syndrome, Bilateral  (ICD-354.0) 23)  Preventive Health Care  (ICD-V70.0) 24)  Osteoporosis  (ICD-733.00) 25)  Gerd  (ICD-530.81) 26)  Sinusitis- Acute-nos  (ICD-461.9) 27)  Hypothyroidism  (ICD-244.9) 28)  Peptic Ulcer Disease  (ICD-533.90) 29)  Osteoarthritis  (ICD-715.90) 30)  Hypertension  (ICD-401.9) 31)  Hyperlipidemia  (ICD-272.4) 32)  Colonic Polyps, Hx of  (ICD-V12.72) 33)  Allergic Rhinitis  (ICD-477.9) 34)  Migraine Headache  (ICD-346.90)  Medications Prior to Update: 1)  Levothyroxine Sodium 75 Mcg  Tabs (Levothyroxine Sodium) .Marland Kitchen.. 1 By Mouth Once Daily 2)  Lipitor 10 Mg  Tabs (Atorvastatin Calcium) .Marland Kitchen.. 1 By Mouth Once Daily 3)  Lisinopril 5 Mg  Tabs (Lisinopril) .Marland Kitchen.. 1 By Mouth Once Daily 4)  Hyoscyamine Sulfate Cr 0.375 Mg  Cp12 (Hyoscyamine Sulfate) .... As Needed 5)  Allegra 180 Mg  Tabs (Fexofenadine Hcl) .Marland Kitchen.. 1 By Mouth Once Daily 6)  Lansoprazole 30 Mg Cpdr (Lansoprazole) .Marland Kitchen.. 1 By Mouth Once Daily 7)  Cyclobenzaprine Hcl 10 Mg  Tabs (Cyclobenzaprine Hcl) .... 1/2 - 1 By Mouth Once Daily As Needed 8)  Fluticasone Propionate 50 Mcg/act Susp (Fluticasone Propionate) .Marland KitchenMarland KitchenMarland Kitchen  2 Spray/side Once Daily 9)  Alprazolam 0.5 Mg  Tabs (Alprazolam) .Marland Kitchen.. 1 By Mouth Q 8 Hrs Prn 10)  Zolpidem Tartrate 10 Mg  Tabs (Zolpidem Tartrate) .Marland Kitchen.. 1 By Mouth Once Daily As Needed 11)  Prednisone 10 Mg Tabs (Prednisone) .... 3po Qd For 3days, Then 2po Qd For 3days, Then 1po Qd For 3days, Then Stop 12)  Cephalexin 500 Mg Caps (Cephalexin) .Marland Kitchen.. 1 By Mouth Three Times A Day  Current Medications (verified): 1)  Levothyroxine Sodium 75 Mcg  Tabs (Levothyroxine Sodium) .Marland Kitchen.. 1 By Mouth Once Daily 2)  Lipitor 10 Mg  Tabs (Atorvastatin Calcium) .Marland Kitchen.. 1 By Mouth Once Daily 3)  Lisinopril 5 Mg  Tabs  (Lisinopril) .Marland Kitchen.. 1 By Mouth Once Daily 4)  Hyoscyamine Sulfate Cr 0.375 Mg  Cp12 (Hyoscyamine Sulfate) .... As Needed 5)  Allegra 180 Mg  Tabs (Fexofenadine Hcl) .Marland Kitchen.. 1 By Mouth Once Daily 6)  Lansoprazole 30 Mg Cpdr (Lansoprazole) .Marland Kitchen.. 1 By Mouth Once Daily 7)  Cyclobenzaprine Hcl 10 Mg  Tabs (Cyclobenzaprine Hcl) .... 1/2 - 1 By Mouth Once Daily As Needed 8)  Fluticasone Propionate 50 Mcg/act Susp (Fluticasone Propionate) .... 2 Spray/side Once Daily 9)  Alprazolam 0.5 Mg  Tabs (Alprazolam) .Marland Kitchen.. 1 By Mouth Q 8 Hrs Prn 10)  Zolpidem Tartrate 10 Mg  Tabs (Zolpidem Tartrate) .Marland Kitchen.. 1 By Mouth Once Daily As Needed 11)  Levofloxacin 500 Mg Tabs (Levofloxacin) .Marland Kitchen.. 1 By Mouth Once Daily 12)  Fluconazole 150 Mg Tabs (Fluconazole) .Marland Kitchen.. 1po Q 3 Days As Needed  Allergies (verified): 1)  ! Tetracycline  Past History:  Past Medical History: Last updated: 12/18/2009 Migraine Headaches Allergic rhinitis Colonic polyps, hx of - adenoma - last 7/09 Hyperlipidemia Hypertension Osteoarthritis Peptic ulcer disease Hypothyroidism GERD knee djd bilat left > right Osteoporosis Ileocecal valve ulcer IBS Anxiety Depression C-spine spondylosis lumbar spondylosis  Past Surgical History: Last updated: 05/31/2008 Appendectomy- 1979 Hysterectomy- 1979 Thyroidectomy- 02/2004 Tonsillectomy-1937 Breat Biopsy-1970 Back Surgery-1990 s/p right hand surgury/trigger surgury - dr Loletta Parish  Social History: Last updated: 05/25/2009 Married; 1 boy, 2 girls Former Smoker Alcohol use-no Married Drug use-no  Risk Factors: Smoking Status: quit (03/16/2007)  Review of Systems       all otherwise negative per pt -    Physical Exam  General:  alert and overweight-appearing.  , mild ill  Head:  normocephalic and atraumatic.   Eyes:  vision grossly intact, pupils equal, and pupils round.   Ears:  bilat tm's red, sinus tender bilat Nose:  no external deformity and no nasal discharge.   Mouth:   pharyngeal erythema and fair dentition.   Neck:  supple but with mild cervical bilat submandibular LA tender Lungs:  normal respiratory effort and normal breath sounds.   Heart:  normal rate and regular rhythm.   Extremities:  no edema, no erythema  Psych:  not depressed appearing and moderately anxious.     Impression & Recommendations:  Problem # 1:  SINUSITIS- ACUTE-NOS (ICD-461.9)  Her updated medication list for this problem includes:    Fluticasone Propionate 50 Mcg/act Susp (Fluticasone propionate) .Marland Kitchen... 2 spray/side once daily    Levofloxacin 500 Mg Tabs (Levofloxacin) .Marland Kitchen... 1 by mouth once daily treat as above, f/u any worsening signs or symptoms   Problem # 2:  HYPERTENSION (ICD-401.9)  Her updated medication list for this problem includes:    Lisinopril 5 Mg Tabs (Lisinopril) .Marland Kitchen... 1 by mouth once daily  BP today: 112/72 Prior BP: 100/62 (12/18/2009)  Labs Reviewed:  K+: 3.9 (05/23/2009) Creat: : 0.9 (05/23/2009)   Chol: 169 (05/23/2009)   HDL: 56.30 (05/23/2009)   LDL: 83 (05/23/2009)   TG: 147.0 (05/23/2009) stable overall by hx and exam, ok to continue meds/tx as is   Problem # 3:  ANXIETY (ICD-300.00)  Her updated medication list for this problem includes:    Alprazolam 0.5 Mg Tabs (Alprazolam) .Marland Kitchen... 1 by mouth q 8 hrs prn stable overall by hx and exam, ok to continue meds/tx as is , declines further consideration of med such as SSRI  Complete Medication List: 1)  Levothyroxine Sodium 75 Mcg Tabs (Levothyroxine sodium) .Marland Kitchen.. 1 by mouth once daily 2)  Lipitor 10 Mg Tabs (Atorvastatin calcium) .Marland Kitchen.. 1 by mouth once daily 3)  Lisinopril 5 Mg Tabs (Lisinopril) .Marland Kitchen.. 1 by mouth once daily 4)  Hyoscyamine Sulfate Cr 0.375 Mg Cp12 (Hyoscyamine sulfate) .... As needed 5)  Allegra 180 Mg Tabs (Fexofenadine hcl) .Marland Kitchen.. 1 by mouth once daily 6)  Lansoprazole 30 Mg Cpdr (Lansoprazole) .Marland Kitchen.. 1 by mouth once daily 7)  Cyclobenzaprine Hcl 10 Mg Tabs (Cyclobenzaprine hcl) .... 1/2  - 1 by mouth once daily as needed 8)  Fluticasone Propionate 50 Mcg/act Susp (Fluticasone propionate) .... 2 spray/side once daily 9)  Alprazolam 0.5 Mg Tabs (Alprazolam) .Marland Kitchen.. 1 by mouth q 8 hrs prn 10)  Zolpidem Tartrate 10 Mg Tabs (Zolpidem tartrate) .Marland Kitchen.. 1 by mouth once daily as needed 11)  Levofloxacin 500 Mg Tabs (Levofloxacin) .Marland Kitchen.. 1 by mouth once daily 12)  Fluconazole 150 Mg Tabs (Fluconazole) .Marland Kitchen.. 1po q 3 days as needed  Patient Instructions: 1)  Please take all new medications as prescribed 2)  Continue all previous medications as before this visit  3)  Please schedule a follow-up appointment March 2012 with CPX labs Prescriptions: FLUCONAZOLE 150 MG TABS (FLUCONAZOLE) 1po q 3 days as needed  #2 x 1   Entered and Authorized by:   Corwin Levins MD   Signed by:   Corwin Levins MD on 01/24/2010   Method used:   Print then Give to Patient   RxID:   [id number] LEVOFLOXACIN 500 MG TABS (LEVOFLOXACIN) 1 by mouth once daily  #10 x 0   Entered and Authorized by:   Corwin Levins MD   Signed by:   Corwin Levins MD on 01/24/2010   Method used:   Print then Give to Patient   RxID:   [id number]    Orders Added: 1)  Est. Patient Level IV [42595]

## 2010-04-23 NOTE — Progress Notes (Signed)
Summary: nuc pre-procedure  Phone Note Outgoing Call   Call placed by: Domenic Polite, CNMT,  December 27, 2009 3:53 PM Call placed to: Patient Summary of Call: Reviewed information on [facility] Information Sheet (see scanned document for further details).  Spoke with patient.  Initial call taken by: Domenic Polite, CNMT,  December 27, 2009 3:53 PM     Nuclear Med Background Indications for Stress Test: Evaluation for Ischemia     Symptoms: Chest Pain, Chest Pain with Exertion, Chest Pressure, Chest Pressure with Exertion    Nuclear Pre-Procedure Cardiac Risk Factors: History of Smoking, Hypertension, Lipids Height (in): 57

## 2010-04-23 NOTE — Progress Notes (Signed)
Summary: Rx  Phone Note Call from Patient Call back at Home Phone [phone]   Caller: Patient Summary of Call: pt called stating that [facility] says that Rx fro Flexeril #90 was not received.I called [facility] and confirmed that Rx was received. I called pt and left a message informing of this. pt told to call back with any further questions of concerns Initial call taken by: Margaret Pyle, CMA,  March 29, 2009 4:04 PM

## 2010-04-23 NOTE — Assessment & Plan Note (Signed)
Summary: PUFFY EYES/SINUS/NWS   Vital Signs:  Patient profile:   77 year old female Height:      59 inches Weight:      133 pounds BMI:     26.96 O2 Sat:      98 % on Room air Temp:     97.9 degrees F oral Pulse rate:   78 / minute BP sitting:   122 / 82  (left arm) Cuff size:   regular  Vitals Entered By: Zella Ball Ewing CMA Duncan Dull) (October 02, 2009 11:21 AM)  O2 Flow:  Room air CC: Sinus congestion, both eye red/RE   Primary Care Provider:  Oliver Barre, MD  CC:  Sinus congestion and both eye red/RE.  History of Present Illness: here with c/o 1 wk worsening eye and sinus symtpoms despite good complacine with meds including the  flonase and allegra;  c/o bilat eye itching, mild redness and puffiness and clear d/c without pain or fever, as well as marked sinus congestion and some diffictuly breathing through the nose with clear congestion;  no tongue swelling, cough or wheezing.  Pt denies CP, sob, doe, wheezing, orthopnea, pnd, worsening LE edema, palps, dizziness or syncope  Pt denies new neuro symptoms such as headache, facial or extremity weakness  No ST, fever, cough.  Has ongoing anxiety, but denies worsening depressive symtpoms, suicidal ideaiton or panic.  Also with soreness to the bilat lower rib cages and laments long and hard her frustation with being unable to lose signifcant wt though she states her calorie intake is realatively low and she is active at the [facility] such as swim classes.  Problems Prior to Update: 1)  Sinusitis- Acute-nos  (ICD-461.9) 2)  Neck Pain  (ICD-723.1) 3)  Subacromial Bursitis, Left  (ICD-726.19) 4)  Rash-nonvesicular  (ICD-782.1) 5)  Breast Pain, Right  (ICD-611.71) 6)  Chest Pain  (ICD-786.50) 7)  Liver Mass  (ICD-235.3) 8)  Abdominal Tenderness, Right Upper Quadrant  (ICD-789.61) 9)  Depression  (ICD-311) 10)  Chest Pain  (ICD-786.50) 11)  Preventive Health Care  (ICD-V70.0) 12)  Anxiety  (ICD-300.00) 13)  Irritable Bowel Syndrome   (ICD-564.1) 14)  Rectal Bleeding  (ICD-569.3) 15)  Blood in Stool  (ICD-578.1) 16)  Abdominal Pain, Generalized  (ICD-789.07) 17)  Carpal Tunnel Syndrome, Bilateral  (ICD-354.0) 18)  Preventive Health Care  (ICD-V70.0) 19)  Osteoporosis  (ICD-733.00) 20)  Gerd  (ICD-530.81) 21)  Sinusitis- Acute-nos  (ICD-461.9) 22)  Hypothyroidism  (ICD-244.9) 23)  Peptic Ulcer Disease  (ICD-533.90) 24)  Osteoarthritis  (ICD-715.90) 25)  Hypertension  (ICD-401.9) 26)  Hyperlipidemia  (ICD-272.4) 27)  Colonic Polyps, Hx of  (ICD-V12.72) 28)  Allergic Rhinitis  (ICD-477.9) 29)  Migraine Headache  (ICD-346.90)  Medications Prior to Update: 1)  Levothyroxine Sodium 75 Mcg  Tabs (Levothyroxine Sodium) .Marland Kitchen.. 1 By Mouth Once Daily 2)  Lipitor 10 Mg  Tabs (Atorvastatin Calcium) .Marland Kitchen.. 1 By Mouth Once Daily 3)  Lisinopril 5 Mg  Tabs (Lisinopril) .Marland Kitchen.. 1 By Mouth Once Daily 4)  Hyoscyamine Sulfate Cr 0.375 Mg  Cp12 (Hyoscyamine Sulfate) .... As Needed 5)  Allegra 180 Mg  Tabs (Fexofenadine Hcl) .Marland Kitchen.. 1 By Mouth Once Daily 6)  Lansoprazole 30 Mg Cpdr (Lansoprazole) .Marland Kitchen.. 1 By Mouth Once Daily 7)  Cyclobenzaprine Hcl 10 Mg  Tabs (Cyclobenzaprine Hcl) .... 1/2 - 1 By Mouth Once Daily As Needed 8)  Fluticasone Propionate 50 Mcg/act Susp (Fluticasone Propionate) .... 2 Spray/side Once Daily 9)  Alprazolam 0.5 Mg  Tabs (Alprazolam) .Marland KitchenMarland KitchenMarland Kitchen  1 By Mouth Q 8 Hrs Prn 10)  Zolpidem Tartrate 10 Mg  Tabs (Zolpidem Tartrate) .Marland Kitchen.. 1 By Mouth Once Daily As Needed 11)  Hydrocodone-Homatropine 5-1.5 Mg/14ml Syrp (Hydrocodone-Homatropine) .Marland Kitchen.. 1 Tsp By Mouth Q 6 Hrs As Needed Cough  Current Medications (verified): 1)  Levothyroxine Sodium 75 Mcg  Tabs (Levothyroxine Sodium) .Marland Kitchen.. 1 By Mouth Once Daily 2)  Lipitor 10 Mg  Tabs (Atorvastatin Calcium) .Marland Kitchen.. 1 By Mouth Once Daily 3)  Lisinopril 5 Mg  Tabs (Lisinopril) .Marland Kitchen.. 1 By Mouth Once Daily 4)  Hyoscyamine Sulfate Cr 0.375 Mg  Cp12 (Hyoscyamine Sulfate) .... As Needed 5)  Allegra 180 Mg   Tabs (Fexofenadine Hcl) .Marland Kitchen.. 1 By Mouth Once Daily 6)  Lansoprazole 30 Mg Cpdr (Lansoprazole) .Marland Kitchen.. 1 By Mouth Once Daily 7)  Cyclobenzaprine Hcl 10 Mg  Tabs (Cyclobenzaprine Hcl) .... 1/2 - 1 By Mouth Once Daily As Needed 8)  Fluticasone Propionate 50 Mcg/act Susp (Fluticasone Propionate) .... 2 Spray/side Once Daily 9)  Alprazolam 0.5 Mg  Tabs (Alprazolam) .Marland Kitchen.. 1 By Mouth Q 8 Hrs Prn 10)  Zolpidem Tartrate 10 Mg  Tabs (Zolpidem Tartrate) .Marland Kitchen.. 1 By Mouth Once Daily As Needed 11)  Prednisone 10 Mg Tabs (Prednisone) .... 3po Qd For 3days, Then 2po Qd For 3days, Then 1po Qd For 3days, Then Stop  Allergies (verified): 1)  ! Tetracycline  Past History:  Past Medical History: Last updated: 05/31/2008 Migraine Headaches Allergic rhinitis Colonic polyps, hx of - adenoma - last 7/09 Hyperlipidemia Hypertension Osteoarthritis Peptic ulcer disease Hypothyroidism GERD knee djd bilat left > right Osteoporosis Ileocecal valve ulcer IBS Anxiety Depression  Past Surgical History: Last updated: 05/31/2008 Appendectomy- 1979 Hysterectomy- 1979 Thyroidectomy- 02/2004 Tonsillectomy-1937 Breat Biopsy-1970 Back Surgery-1990 s/p right hand surgury/trigger surgury - dr Loletta Parish  Social History: Last updated: 05/25/2009 Married; 1 boy, 2 girls Former Smoker Alcohol use-no Married Drug use-no  Risk Factors: Smoking Status: quit (03/16/2007)  Review of Systems       all otherwise negative per pt -    Physical Exam  General:  alert and overweight-appearing.   Head:  normocephalic and atraumatic.   Eyes:  vision grossly intact, pupils equal, and pupils round.  ; bilat conjucnt midl erythema with clear d/c, eyelids puffy/swollen but nontender Ears:  bilat tm's midl erythema, sinus nontednder Nose:  nasal dischargemucosal pallor and mucosal edema.   Mouth:  pharyngeal erythema and fair dentition.   Neck:  supple and no masses.   Lungs:  normal respiratory effort and normal breath  sounds.   Heart:  normal rate and regular rhythm.   Msk:  has some tender to costal margins ant lower rib cages bilat Extremities:  no edema, no erythema    Impression & Recommendations:  Problem # 1:  ALLERGIC RHINITIS (ICD-477.9)  Her updated medication list for this problem includes:    Allegra 180 Mg Tabs (Fexofenadine hcl) .Marland Kitchen... 1 by mouth once daily    Fluticasone Propionate 50 Mcg/act Susp (Fluticasone propionate) .Marland Kitchen... 2 spray/side once daily  with flare - for depo today, and prednisone  pack, treat as above, f/u any worsening signs or symptoms   Orders: Depo- Medrol 40mg  (J1030) Depo- Medrol 80mg  (J1040) Admin of Therapeutic Inj  intramuscular or subcutaneous (86578)  Problem # 2:  CHEST PAIN (ICD-786.50) atypical, delcines  ecg, to check cxr, suspect MSK Orders: T-2 View CXR, Same Day (71020.5TC)  Problem # 3:  HYPERTENSION (ICD-401.9)  Her updated medication list for this problem includes:    Lisinopril  5 Mg Tabs (Lisinopril) .Marland Kitchen... 1 by mouth once daily  BP today: 122/82 Prior BP: 116/90 (05/25/2009)  Labs Reviewed: K+: 3.9 (05/23/2009) Creat: : 0.9 (05/23/2009)   Chol: 169 (05/23/2009)   HDL: 56.30 (05/23/2009)   LDL: 83 (05/23/2009)   TG: 147.0 (05/23/2009) stable overall by hx and exam, ok to continue meds/tx as is   Problem # 4:  ANXIETY (ICD-300.00)  Her updated medication list for this problem includes:    Alprazolam 0.5 Mg Tabs (Alprazolam) .Marland Kitchen... 1 by mouth q 8 hrs prn stable overall by hx and exam, ok to continue meds/tx as is   Complete Medication List: 1)  Levothyroxine Sodium 75 Mcg Tabs (Levothyroxine sodium) .Marland Kitchen.. 1 by mouth once daily 2)  Lipitor 10 Mg Tabs (Atorvastatin calcium) .Marland Kitchen.. 1 by mouth once daily 3)  Lisinopril 5 Mg Tabs (Lisinopril) .Marland Kitchen.. 1 by mouth once daily 4)  Hyoscyamine Sulfate Cr 0.375 Mg Cp12 (Hyoscyamine sulfate) .... As needed 5)  Allegra 180 Mg Tabs (Fexofenadine hcl) .Marland Kitchen.. 1 by mouth once daily 6)  Lansoprazole 30 Mg Cpdr  (Lansoprazole) .Marland Kitchen.. 1 by mouth once daily 7)  Cyclobenzaprine Hcl 10 Mg Tabs (Cyclobenzaprine hcl) .... 1/2 - 1 by mouth once daily as needed 8)  Fluticasone Propionate 50 Mcg/act Susp (Fluticasone propionate) .... 2 spray/side once daily 9)  Alprazolam 0.5 Mg Tabs (Alprazolam) .Marland Kitchen.. 1 by mouth q 8 hrs prn 10)  Zolpidem Tartrate 10 Mg Tabs (Zolpidem tartrate) .Marland Kitchen.. 1 by mouth once daily as needed 11)  Prednisone 10 Mg Tabs (Prednisone) .... 3po qd for 3days, then 2po qd for 3days, then 1po qd for 3days, then stop  Patient Instructions: 1)  you had the steroid shot today 2)  Please take all new medications as prescribed - the prednisone 3)  Continue all previous medications as before this visit  4)  Please go to Radiology in the basement level for your X-Ray today  5)  you can also use Zaditor for the eyes (OTC) 6)  Please schedule a follow-up appointment as needed. 7)  please call if you need referral to allergy Prescriptions: PREDNISONE 10 MG TABS (PREDNISONE) 3po qd for 3days, then 2po qd for 3days, then 1po qd for 3days, then stop  #18 x 0   Entered and Authorized by:   Corwin Levins MD   Signed by:   Corwin Levins MD on 10/02/2009   Method used:   Print then Give to Patient   RxID:   [id number]    Medication Administration  Injection # 1:    Medication: Depo- Medrol 40mg     Diagnosis: ALLERGIC RHINITIS (ICD-477.9)    Route: IM    Site: LUOQ gluteus    Exp Date: 06/2012    Lot #: [id number]    Mfr: [employer]    Given by: Zella Ball Ewing CMA (AAMA) (October 02, 2009 12:23 PM)  Injection # 2:    Medication: Depo- Medrol 80mg     Diagnosis: ALLERGIC RHINITIS (ICD-477.9)    Route: IM    Site: LUOQ gluteus    Exp Date: 06/2012    Lot #: [id number]    Mfr: [employer]    Given by: Zella Ball Ewing CMA Duncan Dull) (October 02, 2009 12:23 PM)  Orders Added: 1)  Depo- Medrol 40mg  [J1030] 2)  Depo- Medrol 80mg  [J1040] 3)  Admin of Therapeutic Inj  intramuscular or subcutaneous [96372] 4)  T-2 View CXR,  Same Day [71020.5TC] 5)  Est. Patient Level IV [41324]

## 2010-04-23 NOTE — Medication Information (Signed)
Summary: Insurance Verification Request/[facility]  Insurance Verification Request/[employer]   Imported By: Lester Pendergrass 02/25/2010 08:20:47  _____________________________________________________________________  External Attachment:    Type:   Image     Comment:   External Document

## 2010-04-23 NOTE — Assessment & Plan Note (Signed)
Summary: sneezing,runny nose,choking cough,chills-lb   Vital Signs:  Patient profile:   77 year old female Height:      57 inches Weight:      135.38 pounds BMI:     29.40 O2 Sat:      93 % on Room air Temp:     97.2 degrees F oral Pulse rate:   94 / minute BP sitting:   102 / 58  (left arm) Cuff size:   regular  Vitals Entered By: Zella Ball Ewing CMA (AAMA) (October 26, 2009 4:01 PM)  O2 Flow:  Room air CC: Congestion and sneezing, spot on right leg and arm/RE   Primary Care Provider:  Oliver Barre, MD  CC:  Congestion and sneezing and spot on right leg and arm/RE.  History of Present Illness: here with acute onset facial pain , pressure, fever adn greenish d/c mild to mod for 3 days with chills as well, on top of several weeks sinus congestion with clearish drainage, no fever and no pain;  denies earacheST, cough, headahce but has malase and general weakness as well;  Pt denies CP, sob, doe, wheezing, orthopnea, pnd, worsening LE edema, palps, dizziness or syncope Pt denies new neuro symptoms such as headache, facial or extremity weakness Overall good compliance with meds, good tolerance  Problems Prior to Update: 1)  Sinusitis- Acute-nos  (ICD-461.9) 2)  Neck Pain  (ICD-723.1) 3)  Subacromial Bursitis, Left  (ICD-726.19) 4)  Rash-nonvesicular  (ICD-782.1) 5)  Breast Pain, Right  (ICD-611.71) 6)  Chest Pain  (ICD-786.50) 7)  Liver Mass  (ICD-235.3) 8)  Abdominal Tenderness, Right Upper Quadrant  (ICD-789.61) 9)  Depression  (ICD-311) 10)  Chest Pain  (ICD-786.50) 11)  Preventive Health Care  (ICD-V70.0) 12)  Anxiety  (ICD-300.00) 13)  Irritable Bowel Syndrome  (ICD-564.1) 14)  Rectal Bleeding  (ICD-569.3) 15)  Blood in Stool  (ICD-578.1) 16)  Abdominal Pain, Generalized  (ICD-789.07) 17)  Carpal Tunnel Syndrome, Bilateral  (ICD-354.0) 18)  Preventive Health Care  (ICD-V70.0) 19)  Osteoporosis  (ICD-733.00) 20)  Gerd  (ICD-530.81) 21)  Sinusitis- Acute-nos  (ICD-461.9) 22)   Hypothyroidism  (ICD-244.9) 23)  Peptic Ulcer Disease  (ICD-533.90) 24)  Osteoarthritis  (ICD-715.90) 25)  Hypertension  (ICD-401.9) 26)  Hyperlipidemia  (ICD-272.4) 27)  Colonic Polyps, Hx of  (ICD-V12.72) 28)  Allergic Rhinitis  (ICD-477.9) 29)  Migraine Headache  (ICD-346.90)  Medications Prior to Update: 1)  Levothyroxine Sodium 75 Mcg  Tabs (Levothyroxine Sodium) .Marland Kitchen.. 1 By Mouth Once Daily 2)  Lipitor 10 Mg  Tabs (Atorvastatin Calcium) .Marland Kitchen.. 1 By Mouth Once Daily 3)  Lisinopril 5 Mg  Tabs (Lisinopril) .Marland Kitchen.. 1 By Mouth Once Daily 4)  Hyoscyamine Sulfate Cr 0.375 Mg  Cp12 (Hyoscyamine Sulfate) .... As Needed 5)  Allegra 180 Mg  Tabs (Fexofenadine Hcl) .Marland Kitchen.. 1 By Mouth Once Daily 6)  Lansoprazole 30 Mg Cpdr (Lansoprazole) .Marland Kitchen.. 1 By Mouth Once Daily 7)  Cyclobenzaprine Hcl 10 Mg  Tabs (Cyclobenzaprine Hcl) .... 1/2 - 1 By Mouth Once Daily As Needed 8)  Fluticasone Propionate 50 Mcg/act Susp (Fluticasone Propionate) .... 2 Spray/side Once Daily 9)  Alprazolam 0.5 Mg  Tabs (Alprazolam) .Marland Kitchen.. 1 By Mouth Q 8 Hrs Prn 10)  Zolpidem Tartrate 10 Mg  Tabs (Zolpidem Tartrate) .Marland Kitchen.. 1 By Mouth Once Daily As Needed 11)  Prednisone 10 Mg Tabs (Prednisone) .... 3po Qd For 3days, Then 2po Qd For 3days, Then 1po Qd For 3days, Then Stop  Current Medications (verified): 1)  Levothyroxine Sodium 75 Mcg  Tabs (Levothyroxine Sodium) .Marland Kitchen.. 1 By Mouth Once Daily 2)  Lipitor 10 Mg  Tabs (Atorvastatin Calcium) .Marland Kitchen.. 1 By Mouth Once Daily 3)  Lisinopril 5 Mg  Tabs (Lisinopril) .Marland Kitchen.. 1 By Mouth Once Daily 4)  Hyoscyamine Sulfate Cr 0.375 Mg  Cp12 (Hyoscyamine Sulfate) .... As Needed 5)  Allegra 180 Mg  Tabs (Fexofenadine Hcl) .Marland Kitchen.. 1 By Mouth Once Daily 6)  Lansoprazole 30 Mg Cpdr (Lansoprazole) .Marland Kitchen.. 1 By Mouth Once Daily 7)  Cyclobenzaprine Hcl 10 Mg  Tabs (Cyclobenzaprine Hcl) .... 1/2 - 1 By Mouth Once Daily As Needed 8)  Fluticasone Propionate 50 Mcg/act Susp (Fluticasone Propionate) .... 2 Spray/side Once Daily 9)   Alprazolam 0.5 Mg  Tabs (Alprazolam) .Marland Kitchen.. 1 By Mouth Q 8 Hrs Prn 10)  Zolpidem Tartrate 10 Mg  Tabs (Zolpidem Tartrate) .Marland Kitchen.. 1 By Mouth Once Daily As Needed 11)  Prednisone 10 Mg Tabs (Prednisone) .... 3po Qd For 3days, Then 2po Qd For 3days, Then 1po Qd For 3days, Then Stop 12)  Cephalexin 500 Mg Caps (Cephalexin) .Marland Kitchen.. 1 By Mouth Three Times A Day  Allergies (verified): 1)  ! Tetracycline  Past History:  Past Medical History: Last updated: 05/31/2008 Migraine Headaches Allergic rhinitis Colonic polyps, hx of - adenoma - last 7/09 Hyperlipidemia Hypertension Osteoarthritis Peptic ulcer disease Hypothyroidism GERD knee djd bilat left > right Osteoporosis Ileocecal valve ulcer IBS Anxiety Depression  Past Surgical History: Last updated: 05/31/2008 Appendectomy- 1979 Hysterectomy- 1979 Thyroidectomy- 02/2004 Tonsillectomy-1937 Breat Biopsy-1970 Back Surgery-1990 s/p right hand surgury/trigger surgury - dr Loletta Parish  Social History: Last updated: 05/25/2009 Married; 1 boy, 2 girls Former Smoker Alcohol use-no Married Drug use-no  Risk Factors: Smoking Status: quit (03/16/2007)  Review of Systems       all otherwise negative per pt -    Physical Exam  General:  alert and well-developed.   Head:  normocephalic and atraumatic.   Eyes:  vision grossly intact, pupils equal, and pupils round.   Ears:  bilat tm's red, sinus tender bilat Nose:  nasal dischargemucosal pallor and mucosal edema.   Mouth:  pharyngeal erythema and fair dentition.   Neck:  supple and no masses.   Lungs:  normal respiratory effort and normal breath sounds.   Heart:  normal rate and regular rhythm.   Extremities:  no edema, no erythema    Impression & Recommendations:  Problem # 1:  SINUSITIS- ACUTE-NOS (ICD-461.9)  Her updated medication list for this problem includes:    Fluticasone Propionate 50 Mcg/act Susp (Fluticasone propionate) .Marland Kitchen... 2 spray/side once daily    Cephalexin  500 Mg Caps (Cephalexin) .Marland Kitchen... 1 by mouth three times a day treat as above, f/u any worsening signs or symptoms   Problem # 2:  ALLERGIC RHINITIS (ICD-477.9)  Her updated medication list for this problem includes:    Allegra 180 Mg Tabs (Fexofenadine hcl) .Marland Kitchen... 1 by mouth once daily    Fluticasone Propionate 50 Mcg/act Susp (Fluticasone propionate) .Marland Kitchen... 2 spray/side once daily treat as above, f/u any worsening signs or symptoms   Problem # 3:  HYPERTENSION (ICD-401.9)  Her updated medication list for this problem includes:    Lisinopril 5 Mg Tabs (Lisinopril) .Marland Kitchen... 1 by mouth once daily  BP today: 102/58 Prior BP: 122/82 (10/02/2009)  Labs Reviewed: K+: 3.9 (05/23/2009) Creat: : 0.9 (05/23/2009)   Chol: 169 (05/23/2009)   HDL: 56.30 (05/23/2009)   LDL: 83 (05/23/2009)   TG: 147.0 (05/23/2009) stable overall by hx and exam,  ok to continue meds/tx as is   Complete Medication List: 1)  Levothyroxine Sodium 75 Mcg Tabs (Levothyroxine sodium) .Marland Kitchen.. 1 by mouth once daily 2)  Lipitor 10 Mg Tabs (Atorvastatin calcium) .Marland Kitchen.. 1 by mouth once daily 3)  Lisinopril 5 Mg Tabs (Lisinopril) .Marland Kitchen.. 1 by mouth once daily 4)  Hyoscyamine Sulfate Cr 0.375 Mg Cp12 (Hyoscyamine sulfate) .... As needed 5)  Allegra 180 Mg Tabs (Fexofenadine hcl) .Marland Kitchen.. 1 by mouth once daily 6)  Lansoprazole 30 Mg Cpdr (Lansoprazole) .Marland Kitchen.. 1 by mouth once daily 7)  Cyclobenzaprine Hcl 10 Mg Tabs (Cyclobenzaprine hcl) .... 1/2 - 1 by mouth once daily as needed 8)  Fluticasone Propionate 50 Mcg/act Susp (Fluticasone propionate) .... 2 spray/side once daily 9)  Alprazolam 0.5 Mg Tabs (Alprazolam) .Marland Kitchen.. 1 by mouth q 8 hrs prn 10)  Zolpidem Tartrate 10 Mg Tabs (Zolpidem tartrate) .Marland Kitchen.. 1 by mouth once daily as needed 11)  Prednisone 10 Mg Tabs (Prednisone) .... 3po qd for 3days, then 2po qd for 3days, then 1po qd for 3days, then stop 12)  Cephalexin 500 Mg Caps (Cephalexin) .Marland Kitchen.. 1 by mouth three times a day  Patient Instructions: 1)   Please take all new medications as prescribed 2)  Continue all previous medications as before this visit  3)  Please schedule a follow-up appointment as needed. Prescriptions: CEPHALEXIN 500 MG CAPS (CEPHALEXIN) 1 by mouth three times a day  #30 x 0   Entered and Authorized by:   Corwin Levins MD   Signed by:   Corwin Levins MD on 10/26/2009   Method used:   Electronically to        [facility]  [phone]* (retail)       [address] [place], [place]  [address]       Ph: [phone] or [phone]       Fax: [phone]   RxID:   [id number]

## 2010-04-23 NOTE — Progress Notes (Signed)
Summary: Rx refill  Phone Note Call from Patient Call back at Home Phone [phone]   Caller: Patient Summary of Call: pt left walk-in message requesting 90 day supply of Ambiem. Ok to do 90 day 0 refills to [facility]? Initial call taken by: Margaret Pyle, CMA,  April 05, 2009 1:42 PM  Follow-up for Phone Call        done hardcopy to LIM side B - dahlia  Follow-up by: Corwin Levins MD,  April 05, 2009 2:02 PM  Additional Follow-up for Phone Call Additional follow up Details #1::        faxed to pharmacy, pt informed Additional Follow-up by: Margaret Pyle, CMA,  April 05, 2009 3:29 PM    New/Updated Medications: ZOLPIDEM TARTRATE 10 MG  TABS (ZOLPIDEM TARTRATE) 1 by mouth once daily as needed Prescriptions: ZOLPIDEM TARTRATE 10 MG  TABS (ZOLPIDEM TARTRATE) 1 by mouth once daily as needed  #90 x 1   Entered and Authorized by:   Corwin Levins MD   Signed by:   Corwin Levins MD on 04/05/2009   Method used:   Print then Give to Patient   RxID:   [id number]

## 2010-04-23 NOTE — Assessment & Plan Note (Signed)
Summary: Cardiology Nuclear Testing  Nuclear Med Background Indications for Stress Test: Evaluation for Ischemia    History Comments: No Documented CAD  Symptoms: Chest Pain, Chest Pain with Exertion, Chest Pressure, Chest Pressure with Exertion, DOE, Fatigue, Light-Headedness  Symptoms Comments: Last episode of CP:now, 7-8/10.   Nuclear Pre-Procedure Cardiac Risk Factors: History of Smoking, Hypertension, Lipids Caffeine/Decaff Intake: None NPO After: 6:00 PM Lungs: Clear.  O2 Sat 99% on RA. IV 0.9% NS with Angio Cath: 22g     IV Site: L Antecubital IV Started by: Irean Hong, RN Chest Size (in) 36     Cup Size D     Height (in): 57 Weight (lb): 131 BMI: 28.45  Nuclear Med Study 1 or 2 day study:  1 day     Stress Test Type:  Treadmill/Lexiscan Reading MD:  Dietrich Pates, MD     Referring MD:  Oliver Barre, MD Resting Radionuclide:  Technetium 20m Tetrofosmin     Resting Radionuclide Dose:  11.0 mCi  Stress Radionuclide:  Technetium 64m Tetrofosmin     Stress Radionuclide Dose:  33.0 mCi   Stress Protocol Exercise Time (min):  2:00 min     Max HR:  131 bpm     Predicted Max HR:  143 bpm  Max Systolic BP: 160 mm Hg     Percent Max HR:  91.61 %Rate Pressure Product:  16109  Lexiscan: 0.4 mg   Stress Test Technologist:  Rea College, CMA-N     Nuclear Technologist:  Doyne Keel, CNMT  Rest Procedure  Myocardial perfusion imaging was performed at rest 45 minutes following the intravenous administration of Technetium 33m Tetrofosmin.  Stress Procedure  The patient received IV Lexiscan 0.4 mg over 15-seconds with concurrent low level exercise and then Technetium 43m Tetrofosmin was injected at 30-seconds.  There were nonspecific ST-T wave changes and occasional PAC's with Lexiscan.  She did c/o chest and throat pressure with infusion.  Quantitative spect images were obtained after a 45 minute delay.  QPS Raw Data Images:  Stress images were motion corrected.  Soft tissue  (diaphragm, breast) surround heart. Stress Images:  Normal homogeneous uptake in all areas of the myocardium. Rest Images:  Normal homogeneous uptake in all areas of the myocardium. Subtraction (SDS):  No evidence of ischemia. Transient Ischemic Dilatation:  1.17  (Normal <1.22)  Lung/Heart Ratio:  0.32  (Normal <0.45)  Quantitative Gated Spect Images QGS EDV:  33 ml QGS ESV:  4 ml QGS EF:  88 %   Overall Impression  Exercise Capacity: Lexiscan with low level exercise BP Response: Normal blood pressure response. Clinical Symptoms: Chest pressure. ECG Impression: No significant ST segment change suggestive of ischemia. Overall Impression: Normal stress nuclear study.  Appended Document: Cardiology Nuclear Testing LMOPT - labs negative, normal, or stable  - No Acute problem   Appended Document: Cardiology Nuclear Testing Pt informed

## 2010-04-23 NOTE — Assessment & Plan Note (Signed)
Summary: 2 MO ROV /NWS  #   Vital Signs:  Patient profile:   77 year old female Height:      59 inches Weight:      134.50 pounds BMI:     27.26 O2 Sat:      93 % on Room air Temp:     97.6 degrees F oral Pulse rate:   87 / minute BP sitting:   116 / 90  (left arm) Cuff size:   regular  Vitals Entered ByZella Ball Ewing (May 25, 2009 8:19 AM)  O2 Flow:  Room air  Preventive Care Screening  Mammogram:    Date:  11/20/2008    Results:  normal   CC: 2 Mo ROV/RE   Primary Care Provider:  Oliver Barre, MD  CC:  2 Mo ROV/RE.  History of Present Illness: overall doing well, Pt denies CP, sob, doe, wheezing, orthopnea, pnd, worsening LE edema, palps, dizziness or syncope  Pt denies new neuro symptoms such as headache, facial or extremity weakness    Preventive Screening-Counseling & Management      Drug Use:  no.    Problems Prior to Update: 1)  Sinusitis- Acute-nos  (ICD-461.9) 2)  Neck Pain  (ICD-723.1) 3)  Subacromial Bursitis, Left  (ICD-726.19) 4)  Rash-nonvesicular  (ICD-782.1) 5)  Breast Pain, Right  (ICD-611.71) 6)  Chest Pain  (ICD-786.50) 7)  Liver Mass  (ICD-235.3) 8)  Abdominal Tenderness, Right Upper Quadrant  (ICD-789.61) 9)  Depression  (ICD-311) 10)  Chest Pain  (ICD-786.50) 11)  Preventive Health Care  (ICD-V70.0) 12)  Anxiety  (ICD-300.00) 13)  Irritable Bowel Syndrome  (ICD-564.1) 14)  Rectal Bleeding  (ICD-569.3) 15)  Blood in Stool  (ICD-578.1) 16)  Abdominal Pain, Generalized  (ICD-789.07) 17)  Carpal Tunnel Syndrome, Bilateral  (ICD-354.0) 18)  Preventive Health Care  (ICD-V70.0) 19)  Osteoporosis  (ICD-733.00) 20)  Gerd  (ICD-530.81) 21)  Sinusitis- Acute-nos  (ICD-461.9) 22)  Hypothyroidism  (ICD-244.9) 23)  Peptic Ulcer Disease  (ICD-533.90) 24)  Osteoarthritis  (ICD-715.90) 25)  Hypertension  (ICD-401.9) 26)  Hyperlipidemia  (ICD-272.4) 27)  Colonic Polyps, Hx of  (ICD-V12.72) 28)  Allergic Rhinitis  (ICD-477.9) 29)  Migraine  Headache  (ICD-346.90)  Medications Prior to Update: 1)  Levothyroxine Sodium 75 Mcg  Tabs (Levothyroxine Sodium) .Marland Kitchen.. 1 By Mouth Once Daily 2)  Lipitor 10 Mg  Tabs (Atorvastatin Calcium) .Marland Kitchen.. 1 By Mouth Once Daily 3)  Lisinopril 5 Mg  Tabs (Lisinopril) .Marland Kitchen.. 1 By Mouth Once Daily 4)  Hyoscyamine Sulfate Cr 0.375 Mg  Cp12 (Hyoscyamine Sulfate) .... As Needed 5)  Allegra 180 Mg  Tabs (Fexofenadine Hcl) .Marland Kitchen.. 1 By Mouth Once Daily 6)  Lansoprazole 30 Mg Cpdr (Lansoprazole) .Marland Kitchen.. 1 By Mouth Once Daily 7)  Cyclobenzaprine Hcl 10 Mg  Tabs (Cyclobenzaprine Hcl) .... 1/2 - 1 By Mouth Once Daily As Needed 8)  Fluticasone Propionate 50 Mcg/act Susp (Fluticasone Propionate) .... 2 Spray/side Once Daily 9)  Alprazolam 0.5 Mg  Tabs (Alprazolam) .Marland Kitchen.. 1 By Mouth Q 8 Hrs Prn 10)  Zolpidem Tartrate 10 Mg  Tabs (Zolpidem Tartrate) .Marland Kitchen.. 1 By Mouth Once Daily As Needed 11)  Cephalexin 500 Mg Caps (Cephalexin) .Marland Kitchen.. 1 By Mouth Three Times A Day 12)  Prednisone 10 Mg Tabs (Prednisone) .... 3po Qd For 3days, Then 2po Qd For 3days, Then 1po Qd For 3days, Then Stop 13)  Hydrocodone-Homatropine 5-1.5 Mg/6ml Syrp (Hydrocodone-Homatropine) .Marland Kitchen.. 1 Tsp By Mouth Q 6 Hrs As Needed Cough  Current Medications (verified): 1)  Levothyroxine Sodium 75 Mcg  Tabs (Levothyroxine Sodium) .Marland Kitchen.. 1 By Mouth Once Daily 2)  Lipitor 10 Mg  Tabs (Atorvastatin Calcium) .Marland Kitchen.. 1 By Mouth Once Daily 3)  Lisinopril 5 Mg  Tabs (Lisinopril) .Marland Kitchen.. 1 By Mouth Once Daily 4)  Hyoscyamine Sulfate Cr 0.375 Mg  Cp12 (Hyoscyamine Sulfate) .... As Needed 5)  Allegra 180 Mg  Tabs (Fexofenadine Hcl) .Marland Kitchen.. 1 By Mouth Once Daily 6)  Lansoprazole 30 Mg Cpdr (Lansoprazole) .Marland Kitchen.. 1 By Mouth Once Daily 7)  Cyclobenzaprine Hcl 10 Mg  Tabs (Cyclobenzaprine Hcl) .... 1/2 - 1 By Mouth Once Daily As Needed 8)  Fluticasone Propionate 50 Mcg/act Susp (Fluticasone Propionate) .... 2 Spray/side Once Daily 9)  Alprazolam 0.5 Mg  Tabs (Alprazolam) .Marland Kitchen.. 1 By Mouth Q 8 Hrs Prn 10)   Zolpidem Tartrate 10 Mg  Tabs (Zolpidem Tartrate) .Marland Kitchen.. 1 By Mouth Once Daily As Needed 11)  Hydrocodone-Homatropine 5-1.5 Mg/13ml Syrp (Hydrocodone-Homatropine) .Marland Kitchen.. 1 Tsp By Mouth Q 6 Hrs As Needed Cough  Allergies (verified): 1)  ! Tetracycline  Past History:  Past Medical History: Last updated: 05/31/2008 Migraine Headaches Allergic rhinitis Colonic polyps, hx of - adenoma - last 7/09 Hyperlipidemia Hypertension Osteoarthritis Peptic ulcer disease Hypothyroidism GERD knee djd bilat left > right Osteoporosis Ileocecal valve ulcer IBS Anxiety Depression  Past Surgical History: Last updated: 05/31/2008 Appendectomy- 1979 Hysterectomy- 1979 Thyroidectomy- 02/2004 Tonsillectomy-1937 Breat Biopsy-1970 Back Surgery-1990 s/p right hand surgury/trigger surgury - dr Loletta Parish  Family History: Last updated: 03/16/2007 sister with leukemia mother with dementia  Social History: Last updated: 05/25/2009 Married; 1 boy, 2 girls Former Smoker Alcohol use-no Married Drug use-no  Risk Factors: Smoking Status: quit (03/16/2007)  Social History: Married; 1 boy, 2 girls Former Smoker Alcohol use-no Married Drug use-no Drug Use:  no  Review of Systems  The patient denies anorexia, fever, vision loss, decreased hearing, hoarseness, chest pain, syncope, dyspnea on exertion, peripheral edema, prolonged cough, headaches, hemoptysis, abdominal pain, melena, hematochezia, severe indigestion/heartburn, hematuria, incontinence, muscle weakness, suspicious skin lesions, transient blindness, difficulty walking, unusual weight change, abnormal bleeding, enlarged lymph nodes, and angioedema.         all otherwise negative per pt  Physical Exam  General:  alert and overweight-appearing.   Head:  normocephalic and atraumatic.   Eyes:  vision grossly intact, pupils equal, and pupils round.   Ears:  R ear normal and L ear normal.   Nose:  no external deformity and no nasal  discharge.   Mouth:  no gingival abnormalities and pharynx pink and moist.   Neck:  supple and no masses.   Lungs:  normal respiratory effort and normal breath sounds.   Heart:  normal rate and regular rhythm.   Abdomen:  soft, non-tender, and normal bowel sounds.   Msk:  no joint tenderness and no joint swelling.   Extremities:  no edema, no erythema  Neurologic:  cranial nerves II-XII intact and strength normal in all extremities.   Psych:  not depressed appearing and moderately anxious.     Impression & Recommendations:  Problem # 1:  Preventive Health Care (ICD-V70.0) Overall doing well, age appropriate education and counseling updated and referral for appropriate preventive services done unless declined, immunizations up to date or declined, diet counseling done if overweight, urged to quit smoking if smokes , most recent labs reviewed and current ordered if appropriate, ecg reviewed or declined (interpretation per ECG scanned in the EMR if done); information regarding Medicare Prevention requirements given  if appropriate   Problem # 2:  HYPERTENSION (ICD-401.9)  Her updated medication list for this problem includes:    Lisinopril 5 Mg Tabs (Lisinopril) .Marland Kitchen... 1 by mouth once daily  BP today: 116/90 Prior BP: 110/80 (04/10/2009)  Labs Reviewed: K+: 3.9 (05/23/2009) Creat: : 0.9 (05/23/2009)   Chol: 169 (05/23/2009)   HDL: 56.30 (05/23/2009)   LDL: 83 (05/23/2009)   TG: 147.0 (05/23/2009) stable overall by hx and exam, ok to continue meds/tx as is   Complete Medication List: 1)  Levothyroxine Sodium 75 Mcg Tabs (Levothyroxine sodium) .Marland Kitchen.. 1 by mouth once daily 2)  Lipitor 10 Mg Tabs (Atorvastatin calcium) .Marland Kitchen.. 1 by mouth once daily 3)  Lisinopril 5 Mg Tabs (Lisinopril) .Marland Kitchen.. 1 by mouth once daily 4)  Hyoscyamine Sulfate Cr 0.375 Mg Cp12 (Hyoscyamine sulfate) .... As needed 5)  Allegra 180 Mg Tabs (Fexofenadine hcl) .Marland Kitchen.. 1 by mouth once daily 6)  Lansoprazole 30 Mg Cpdr  (Lansoprazole) .Marland Kitchen.. 1 by mouth once daily 7)  Cyclobenzaprine Hcl 10 Mg Tabs (Cyclobenzaprine hcl) .... 1/2 - 1 by mouth once daily as needed 8)  Fluticasone Propionate 50 Mcg/act Susp (Fluticasone propionate) .... 2 spray/side once daily 9)  Alprazolam 0.5 Mg Tabs (Alprazolam) .Marland Kitchen.. 1 by mouth q 8 hrs prn 10)  Zolpidem Tartrate 10 Mg Tabs (Zolpidem tartrate) .Marland Kitchen.. 1 by mouth once daily as needed 11)  Hydrocodone-homatropine 5-1.5 Mg/43ml Syrp (Hydrocodone-homatropine) .Marland Kitchen.. 1 tsp by mouth q 6 hrs as needed cough  Patient Instructions: 1)  Continue all previous medications as before this visit 2)  you are currently up to date with your prescriptions and tests 3)  Please schedule a follow-up appointment in 6 months. Prescriptions: LIPITOR 10 MG  TABS (ATORVASTATIN CALCIUM) 1 by mouth once daily  #30 x 11   Entered and Authorized by:   Corwin Levins MD   Signed by:   Corwin Levins MD on 05/25/2009   Method used:   Electronically to        [facility]  [phone]* (retail)       [address] [place], [place]  [address]       Ph: [phone] or [phone]       Fax: [phone]   RxID:   [id number]

## 2010-04-25 NOTE — Assessment & Plan Note (Signed)
Summary: congestion/nws   Vital Signs:  Patient profile:   77 year old female Height:      60 inches Weight:      133 pounds BMI:     26.07 O2 Sat:      98 % on Room air Temp:     97.7 degrees F oral Pulse rate:   87 / minute BP sitting:   110 / 70  (left arm) Cuff size:   regular  Vitals Entered By: Zella Ball Ewing CMA Duncan Dull) (March 06, 2010 1:29 PM)  O2 Flow:  Room air CC: Congestion, difficulty swallowing/RE   Primary Care Zakhia Seres:  Oliver Barre, MD  CC:  Congestion and difficulty swallowing/RE.  History of Present Illness: here with acute - did better after last visit, but today here with c/o 3 days onset facial pain, pressure, fever and greenish d/c, with slight ST, adn non prod cough with mild wheezing today,  also on top of several wks worsening nasal allergy symtpoms, as well as overt reflux without dysphagia, n/v, abd pain, bowel change or blood, but med review reveals she has not been taking her meds for this in the past month.  Has been using freiends leftover nexium a few times per wk which helps.  Pt denies CP, worsening sob, doe, orthopnea, pnd, worsening LE edema, palps, dizziness or syncope .  Pt denies new neuro symptoms such as headache, facial or extremity weakness  Pt denies polydipsia, polyuria   Overall good compliance with meds, trying to follow low chol diet, wt stable, little excercise however . Denies worsening depressive symptoms, suicidal ideation, or panic.    Problems Prior to Update: 1)  Wheezing  (ICD-786.07) 2)  Spondylosis, Lumbar  (ICD-721.3) 3)  Spondylosis, Cervical  (ICD-721.0) 4)  Joint Effusion, Left Knee  (ICD-719.06) 5)  Chest Pain  (ICD-786.50) 6)  Neck Pain  (ICD-723.1) 7)  Sinusitis- Acute-nos  (ICD-461.9) 8)  Neck Pain  (ICD-723.1) 9)  Subacromial Bursitis, Left  (ICD-726.19) 10)  Rash-nonvesicular  (ICD-782.1) 11)  Breast Pain, Right  (ICD-611.71) 12)  Chest Pain  (ICD-786.50) 13)  Liver Mass  (ICD-235.3) 14)  Abdominal  Tenderness, Right Upper Quadrant  (ICD-789.61) 15)  Depression  (ICD-311) 16)  Chest Pain  (ICD-786.50) 17)  Preventive Health Care  (ICD-V70.0) 18)  Anxiety  (ICD-300.00) 19)  Irritable Bowel Syndrome  (ICD-564.1) 20)  Rectal Bleeding  (ICD-569.3) 21)  Blood in Stool  (ICD-578.1) 22)  Abdominal Pain, Generalized  (ICD-789.07) 23)  Carpal Tunnel Syndrome, Bilateral  (ICD-354.0) 24)  Preventive Health Care  (ICD-V70.0) 25)  Osteoporosis  (ICD-733.00) 26)  Gerd  (ICD-530.81) 27)  Sinusitis- Acute-nos  (ICD-461.9) 28)  Hypothyroidism  (ICD-244.9) 29)  Peptic Ulcer Disease  (ICD-533.90) 30)  Osteoarthritis  (ICD-715.90) 31)  Hypertension  (ICD-401.9) 32)  Hyperlipidemia  (ICD-272.4) 33)  Colonic Polyps, Hx of  (ICD-V12.72) 34)  Allergic Rhinitis  (ICD-477.9) 35)  Migraine Headache  (ICD-346.90)  Medications Prior to Update: 1)  Levothyroxine Sodium 75 Mcg  Tabs (Levothyroxine Sodium) .Marland Kitchen.. 1 By Mouth Once Daily 2)  Lipitor 10 Mg  Tabs (Atorvastatin Calcium) .Marland Kitchen.. 1 By Mouth Once Daily 3)  Lisinopril 5 Mg  Tabs (Lisinopril) .Marland Kitchen.. 1 By Mouth Once Daily 4)  Hyoscyamine Sulfate Cr 0.375 Mg  Cp12 (Hyoscyamine Sulfate) .... As Needed 5)  Allegra 180 Mg  Tabs (Fexofenadine Hcl) .Marland Kitchen.. 1 By Mouth Once Daily 6)  Lansoprazole 30 Mg Cpdr (Lansoprazole) .Marland Kitchen.. 1 By Mouth Once Daily 7)  Cyclobenzaprine Hcl 10 Mg  Tabs (Cyclobenzaprine Hcl) .... 1/2 - 1 By Mouth Once Daily As Needed 8)  Fluticasone Propionate 50 Mcg/act Susp (Fluticasone Propionate) .... 2 Spray/side Once Daily 9)  Alprazolam 0.5 Mg  Tabs (Alprazolam) .Marland Kitchen.. 1 By Mouth Q 8 Hrs Prn 10)  Zolpidem Tartrate 10 Mg  Tabs (Zolpidem Tartrate) .Marland Kitchen.. 1 By Mouth Once Daily As Needed 11)  Levofloxacin 500 Mg Tabs (Levofloxacin) .Marland Kitchen.. 1 By Mouth Once Daily 12)  Fluconazole 150 Mg Tabs (Fluconazole) .Marland Kitchen.. 1po Q 3 Days As Needed  Current Medications (verified): 1)  Levothyroxine Sodium 75 Mcg  Tabs (Levothyroxine Sodium) .Marland Kitchen.. 1 By Mouth Once Daily 2)   Lipitor 10 Mg  Tabs (Atorvastatin Calcium) .Marland Kitchen.. 1 By Mouth Once Daily 3)  Lisinopril 5 Mg  Tabs (Lisinopril) .Marland Kitchen.. 1 By Mouth Once Daily 4)  Hyoscyamine Sulfate Cr 0.375 Mg  Cp12 (Hyoscyamine Sulfate) .... As Needed 5)  Fexofenadine Hcl 180 Mg Tabs (Fexofenadine Hcl) .Marland Kitchen.. 1po Once Daily For Allergies 6)  Lansoprazole 30 Mg Cpdr (Lansoprazole) .Marland Kitchen.. 1 By Mouth Once Daily 7)  Cyclobenzaprine Hcl 10 Mg  Tabs (Cyclobenzaprine Hcl) .... 1/2 - 1 By Mouth Once Daily As Needed 8)  Fluticasone Propionate 50 Mcg/act Susp (Fluticasone Propionate) .... 2 Spray/side Once Daily 9)  Alprazolam 0.5 Mg  Tabs (Alprazolam) .Marland Kitchen.. 1 By Mouth Q 8 Hrs Prn 10)  Zolpidem Tartrate 10 Mg  Tabs (Zolpidem Tartrate) .Marland Kitchen.. 1 By Mouth Once Daily As Needed 11)  Azithromycin 250 Mg Tabs (Azithromycin) .... 2po Qd For 1 Day, Then 1po Qd For 4days, Then Stop 12)  Fluconazole 150 Mg Tabs (Fluconazole) .Marland Kitchen.. 1po Q 3 Days As Needed  Allergies (verified): 1)  ! Tetracycline  Past History:  Past Medical History: Last updated: 12/18/2009 Migraine Headaches Allergic rhinitis Colonic polyps, hx of - adenoma - last 7/09 Hyperlipidemia Hypertension Osteoarthritis Peptic ulcer disease Hypothyroidism GERD knee djd bilat left > right Osteoporosis Ileocecal valve ulcer IBS Anxiety Depression C-spine spondylosis lumbar spondylosis  Past Surgical History: Last updated: 05/31/2008 Appendectomy- 1979 Hysterectomy- 1979 Thyroidectomy- 02/2004 Tonsillectomy-1937 Breat Biopsy-1970 Back Surgery-1990 s/p right hand surgury/trigger surgury - dr Loletta Parish  Social History: Last updated: 05/25/2009 Married; 1 boy, 2 girls Former Smoker Alcohol use-no Married Drug use-no  Risk Factors: Smoking Status: quit (03/16/2007)  Review of Systems       all otherwise negative per pt -    Physical Exam  General:  alert and overweight-appearing.  , mild ill  Head:  normocephalic and atraumatic.   Eyes:  vision grossly intact,  pupils equal, and pupils round.   Ears:  bilat tm's red, sinus tender bilat Nose:  no external deformity and no nasal discharge.   Mouth:  pharyngeal erythema and fair dentition.   Neck:  supple but with mild cervical bilat submandibular LA tender Lungs:  normal respiratory effort, R decreased breath sounds, and L decreased breath sounds.  with few bilat wheeze noted, no rales Heart:  normal rate and regular rhythm.   Extremities:  no edema, no erythema  Psych:  normally interactive and moderately anxious.     Impression & Recommendations:  Problem # 1:  SINUSITIS- ACUTE-NOS (ICD-461.9)  Her updated medication list for this problem includes:    Fluticasone Propionate 50 Mcg/act Susp (Fluticasone propionate) .Marland Kitchen... 2 spray/side once daily    Azithromycin 250 Mg Tabs (Azithromycin) .Marland Kitchen... 2po qd for 1 day, then 1po qd for 4days, then stop treat as above, f/u any worsening signs or symptoms   Problem # 2:  WHEEZING (  ICD-786.07)  mild at best, likely related to above, for depomedrol IM today., ok to hold off on predpack for home  Orders: Depo- Medrol 40mg  (J1030) Depo- Medrol 80mg  (J1040) Admin of Therapeutic Inj  intramuscular or subcutaneous (04540)  Problem # 3:  ALLERGIC RHINITIS (ICD-477.9)  Her updated medication list for this problem includes:    Fexofenadine Hcl 180 Mg Tabs (Fexofenadine hcl) .Marland Kitchen... 1po once daily for allergies    Fluticasone Propionate 50 Mcg/act Susp (Fluticasone propionate) .Marland Kitchen... 2 spray/side once daily to re-start meds; treat as above, f/u any worsening signs or symptoms   Problem # 4:  GERD (ICD-530.81)  Her updated medication list for this problem includes:    Hyoscyamine Sulfate Cr 0.375 Mg Cp12 (Hyoscyamine sulfate) .Marland Kitchen... As needed    Lansoprazole 30 Mg Cpdr (Lansoprazole) .Marland Kitchen... 1 by mouth once daily to re-start med, treat as above, f/u any worsening signs or symptoms   Problem # 5:  OSTEOPOROSIS (ICD-733.00) I think pt should pursue prolia, but  she hesitates due to side effects she read about, and GYn Dr Audie Box suggested he though she might not need it;  pt educated reassured - I think she should go ahead with the tx  Complete Medication List: 1)  Levothyroxine Sodium 75 Mcg Tabs (Levothyroxine sodium) .Marland Kitchen.. 1 by mouth once daily 2)  Lipitor 10 Mg Tabs (Atorvastatin calcium) .Marland Kitchen.. 1 by mouth once daily 3)  Lisinopril 5 Mg Tabs (Lisinopril) .Marland Kitchen.. 1 by mouth once daily 4)  Hyoscyamine Sulfate Cr 0.375 Mg Cp12 (Hyoscyamine sulfate) .... As needed 5)  Fexofenadine Hcl 180 Mg Tabs (Fexofenadine hcl) .Marland Kitchen.. 1po once daily for allergies 6)  Lansoprazole 30 Mg Cpdr (Lansoprazole) .Marland Kitchen.. 1 by mouth once daily 7)  Cyclobenzaprine Hcl 10 Mg Tabs (Cyclobenzaprine hcl) .... 1/2 - 1 by mouth once daily as needed 8)  Fluticasone Propionate 50 Mcg/act Susp (Fluticasone propionate) .... 2 spray/side once daily 9)  Alprazolam 0.5 Mg Tabs (Alprazolam) .Marland Kitchen.. 1 by mouth q 8 hrs prn 10)  Zolpidem Tartrate 10 Mg Tabs (Zolpidem tartrate) .Marland Kitchen.. 1 by mouth once daily as needed 11)  Azithromycin 250 Mg Tabs (Azithromycin) .... 2po qd for 1 day, then 1po qd for 4days, then stop 12)  Fluconazole 150 Mg Tabs (Fluconazole) .Marland Kitchen.. 1po q 3 days as needed  Patient Instructions: 1)  you had the steroid shot today for the mild chest wheezing and allergies 2)  Please take all new medications as prescribed - the antibiotic for the sinus 3)  Please re-start the allegra and nasal spray for the allergies 4)  Continue all previous medications as before this visit  5)  I think the prolia would be fine to start, as the risks we discussed are extremely LOW chance and I have never seen them 6)  Please schedule a follow-up appointment as planned Prescriptions: FLUTICASONE PROPIONATE 50 MCG/ACT SUSP (FLUTICASONE PROPIONATE) 2 spray/side once daily  #1 x 11   Entered and Authorized by:   Corwin Levins MD   Signed by:   Corwin Levins MD on 03/06/2010   Method used:   Print then Give to  Patient   RxID:   [id number] FEXOFENADINE HCL 180 MG TABS (FEXOFENADINE HCL) 1po once daily for allergies  #30 x 11   Entered and Authorized by:   Corwin Levins MD   Signed by:   Corwin Levins MD on 03/06/2010   Method used:   Print then Give to Patient   RxID:   [id number]  LANSOPRAZOLE 30 MG CPDR (LANSOPRAZOLE) 1 by mouth once daily  #90 x 3   Entered and Authorized by:   Corwin Levins MD   Signed by:   Corwin Levins MD on 03/06/2010   Method used:   Print then Give to Patient   RxID:   [id number] AZITHROMYCIN 250 MG TABS (AZITHROMYCIN) 2po qd for 1 day, then 1po qd for 4days, then stop  #6 x 1   Entered and Authorized by:   Corwin Levins MD   Signed by:   Corwin Levins MD on 03/06/2010   Method used:   Print then Give to Patient   RxID:   [id number]    Medication Administration  Injection # 1:    Medication: Depo- Medrol 40mg     Diagnosis: WHEEZING (ICD-786.07)    Route: IM    Site: RUOQ gluteus    Exp Date: 06/2012    Lot #: [id number]    Mfr: [employer]    Comments: Patient received 120mg  Depo-medrol    Patient tolerated injection without complications    Given by: Zella Ball Ewing CMA Duncan Dull) (March 06, 2010 3:05 PM)  Injection # 2:    Medication: Depo- Medrol 80mg     Diagnosis: WHEEZING (ICD-786.07)    Route: IM    Site: RUOQ gluteus    Exp Date: 06/2012    Lot #: [id number]    Mfr: [employer]    Given by: Zella Ball Ewing CMA Duncan Dull) (March 06, 2010 3:05 PM)  Orders Added: 1)  Depo- Medrol 40mg  [J1030] 2)  Depo- Medrol 80mg  [J1040] 3)  Admin of Therapeutic Inj  intramuscular or subcutaneous [96372] 4)  Est. Patient Level IV [95284]

## 2010-04-25 NOTE — Medication Information (Signed)
Summary: Precert for Prolia/[employer]  Precert for Prolia/[employer]   Imported By: Sherian Rein 03/06/2010 09:16:51  _____________________________________________________________________  External Attachment:    Type:   Image     Comment:   External Document

## 2010-04-25 NOTE — Progress Notes (Signed)
Summary: Prolia-approved  Phone Note Outgoing Call   Summary of Call: faxed Prolia ins verification request.............Marland Kitchenwill wait for benefits summary. Initial call taken by: Lanier Prude, [facility]),  February 21, 2010 8:05 AM  Follow-up for Phone Call        requested PA form to be completed. Lanier Prude, [facility]),  March 01, 2010 11:44 AM  PA form signed/faxed....Marland KitchenMarland KitchenLanier Prude, [facility])  March 01, 2010 1:42 PM  Rec approval from [place] that Prolia was approved from 03/01/10-03/23/10  Additional Follow-up for Phone Call Additional follow up Details #1::        rec benefits summary from [employer]. Pt will owe 20%=approx $165  OOP    or 20% plus $35 co pay if given at [facility]. left mess for pt to call back  Additional Follow-up by: Lanier Prude, [facility]),  March 08, 2010 10:41 AM    Additional Follow-up for Phone Call Additional follow up Details #2::    pt informed. She is undecided about whether to proceed with Prolia. I am mailing her additonal info on Prolia and she states she will call us if/when she decides to have inj. Follow-up by: Lanier Prude, [facility]),  March 08, 2010 1:27 PM  Additional Follow-up for Phone Call Additional follow up Details #3:: Details for Additional Follow-up Action Taken: noted Additional Follow-up by: Corwin Levins MD,  March 08, 2010 1:38 PM

## 2010-04-25 NOTE — Medication Information (Signed)
Summary: Approval Prolia/[facility]  Approval Prol[facility]   Imported By: Lester Garrett 03/20/2010 09:19:24  _____________________________________________________________________  External Attachment:    Type:   Image     Comment:   External Document

## 2010-04-25 NOTE — Progress Notes (Signed)
Summary: lipitor  Phone Note Call from Patient Call back at Home Phone [phone] Call back at [phone]   Caller: Patient Reason for Call: Talk to Nurse Summary of Call: unable to afford lipitor.... it is 40.00 for the genric. switch to  something else? pt is switching insurance the first of the year, do we have samples to give her till the first of the year? please advise Initial call taken by: Migdalia Dk,  March 04, 2010 11:17 AM  Follow-up for Phone Call        ok for samples, but I dont think we have any  ok to hold for now until jan 1, to see if can continue the same med; as these types of meds are not easily interchangeable due to risk of side effect and efficacy  Follow-up by: Corwin Levins MD,  March 04, 2010 1:02 PM  Additional Follow-up for Phone Call Additional follow up Details #1::        Pt advised and will wait until Jan 1st to try with new Insurance and if not she will call back. Additional Follow-up by: Margaret Pyle, CMA,  March 04, 2010 4:25 PM

## 2010-05-30 ENCOUNTER — Encounter (INDEPENDENT_AMBULATORY_CARE_PROVIDER_SITE_OTHER): Payer: Self-pay | Admitting: *Deleted

## 2010-05-30 ENCOUNTER — Other Ambulatory Visit: Payer: BLUE CROSS/BLUE SHIELD

## 2010-05-30 ENCOUNTER — Other Ambulatory Visit: Payer: Self-pay | Admitting: Internal Medicine

## 2010-05-30 DIAGNOSIS — Z0000 Encounter for general adult medical examination without abnormal findings: Secondary | ICD-10-CM

## 2010-05-30 DIAGNOSIS — E039 Hypothyroidism, unspecified: Secondary | ICD-10-CM

## 2010-05-30 LAB — URINALYSIS, ROUTINE W REFLEX MICROSCOPIC
Nitrite: NEGATIVE
Specific Gravity, Urine: 1.025 (ref 1.000–1.030)
Total Protein, Urine: NEGATIVE
pH: 5.5 (ref 5.0–8.0)

## 2010-05-30 LAB — CBC WITH DIFFERENTIAL/PLATELET
Basophils Relative: 0.5 % (ref 0.0–3.0)
Eosinophils Relative: 2.8 % (ref 0.0–5.0)
Hemoglobin: 12.4 g/dL (ref 12.0–15.0)
Lymphocytes Relative: 35 % (ref 12.0–46.0)
MCV: 86.3 fl (ref 78.0–100.0)
Neutro Abs: 4.1 10*3/uL (ref 1.4–7.7)
Neutrophils Relative %: 53.7 % (ref 43.0–77.0)
RBC: 4.23 Mil/uL (ref 3.87–5.11)
WBC: 7.7 10*3/uL (ref 4.5–10.5)

## 2010-05-30 LAB — BASIC METABOLIC PANEL
Calcium: 9 mg/dL (ref 8.4–10.5)
Creatinine, Ser: 1 mg/dL (ref 0.4–1.2)
GFR: 60.5 mL/min (ref >60.00)
Sodium: 139 mEq/L (ref 135–145)

## 2010-05-30 LAB — LIPID PANEL
HDL: 54.5 mg/dL (ref >39.00)
LDL Cholesterol: 81 mg/dL (ref 0–99)
Total CHOL/HDL Ratio: 3
Triglycerides: 115 mg/dL (ref 0.0–149.0)

## 2010-05-30 LAB — HEPATIC FUNCTION PANEL
Bilirubin, Direct: 0.1 mg/dL (ref 0.0–0.3)
Total Bilirubin: 0.3 mg/dL (ref 0.3–1.2)

## 2010-06-06 LAB — POCT I-STAT, CHEM 8
Creatinine, Ser: 1 mg/dL (ref 0.4–1.2)
HCT: 39 % (ref 36.0–46.0)
Hemoglobin: 13.3 g/dL (ref 12.0–15.0)
Sodium: 141 mEq/L (ref 135–145)
TCO2: 26 mmol/L (ref 0–100)

## 2010-06-06 LAB — POCT CARDIAC MARKERS: Myoglobin, poc: 101 ng/mL (ref 12–200)

## 2010-06-11 ENCOUNTER — Ambulatory Visit (INDEPENDENT_AMBULATORY_CARE_PROVIDER_SITE_OTHER): Payer: Medicare Other | Admitting: Internal Medicine

## 2010-06-11 ENCOUNTER — Encounter: Payer: Self-pay | Admitting: Internal Medicine

## 2010-06-11 VITALS — BP 120/82 | HR 77 | Temp 97.4°F | Ht <= 58 in | Wt 130.0 lb

## 2010-06-11 DIAGNOSIS — Z0000 Encounter for general adult medical examination without abnormal findings: Secondary | ICD-10-CM | POA: Insufficient documentation

## 2010-06-11 DIAGNOSIS — E039 Hypothyroidism, unspecified: Secondary | ICD-10-CM

## 2010-06-11 DIAGNOSIS — E559 Vitamin D deficiency, unspecified: Secondary | ICD-10-CM

## 2010-06-11 MED ORDER — LEVOTHYROXINE SODIUM 50 MCG PO TABS: 50.0000 ug | ORAL_TABLET | Freq: Every day | ORAL | Status: DC

## 2010-06-11 NOTE — Assessment & Plan Note (Signed)
Currently overcontrolled - to decrease the thyroid med to 50 mcg,  F/u TSH in 4 wks

## 2010-06-11 NOTE — Patient Instructions (Addendum)
Please return for LAB only in 4 wks:  TSH 244.8,  And Vit D  268.9 Continue all other medications as before

## 2010-06-11 NOTE — Progress Notes (Signed)
Subjective:    Patient ID: Julie Fritz, female    DOB: December 30, 1930, 77 y.o.   MRN: [id number]  HPI  Here for wellness and f/u;  Overall doing ok;  Pt denies CP, worsening SOB, DOE, wheezing, orthopnea, PND, worsening LE edema, palpitations, dizziness or syncope.  Pt denies neurological change such as new Headache, facial or extremity weakness.  Pt denies polydipsia, polyuria, or low sugar symptoms. Pt states overall good compliance with treatment and medications, good tolerability, and trying to follow lower cholesterol diet.  Pt denies worsening depressive symptoms, suicidal ideation or panic. No fever, wt loss, night sweats, loss of appetite, or other constitutional symptoms.  Pt states good ability with ADL's, low fall risk, home safety reviewed and adequate, no significant changes in hearing or vision, and occasionally active with exercise.  Past Medical History  Diagnosis Date  . FHx: migraine headaches   . ALLERGIC RHINITIS   . Hx of colonic polyps     hx of adenoma last 7/09  . Hyperlipidemia   . Hypertension   . Osteoarthritis   . Peptic ulcer disease   . Hypothyroidism   . GERD (gastroesophageal reflux disease)   . Left knee DJD   . Right knee DJD   . Osteoporosis   . Ileocecal valve syndrome   . IBS (irritable bowel syndrome)   . Anxiety   . Depression   . Lumbar spondylosis    Past Surgical History  Procedure Date  . Thyroidectomy     02/2004  . Appendectomy     1979  . Abdominal hysterectomy   . Tonsillectomy     1937  . Breast biopsy     1970  . Back surgery     1990  . Right hand surgery trigger surgury   . S/p right hand surgury/trigger surgury     Dr. Myerdierck    reports that she quit smoking about 6 years ago. Her smoking use included Cigarettes. She has quit using smokeless tobacco. She reports that she does not drink alcohol. Her drug history not on file. family history includes Dementia in her mother and Leukemia in her sister. Allergies    Allergen Reactions  . Tetracycline       Review of Systems Review of Systems  Constitutional: Negative for diaphoresis, activity change, appetite change and unexpected weight change.  HENT: Negative for hearing loss, ear pain, facial swelling, mouth sores and neck stiffness.   Eyes: Negative for pain, redness and visual disturbance.  Respiratory: Negative for shortness of breath and wheezing.   Cardiovascular: Negative for chest pain and palpitations.  Gastrointestinal: Negative for diarrhea, blood in stool, abdominal distention and rectal pain.  Genitourinary: Negative for hematuria, flank pain and decreased urine volume.  Musculoskeletal: Negative for myalgias and joint swelling.  Skin: Negative for color change and wound.  Neurological: Negative for syncope and numbness.  Hematological: Negative for adenopathy.  Psychiatric/Behavioral: Negative for hallucinations, self-injury, decreased concentration and agitation.      Objective:   Physical Exam Physical Exam  Constitutional: Pt is oriented to person, place, and time. Appears well-developed and well-nourished.  HENT:  Head: Normocephalic and atraumatic.  Right Ear: External ear normal.  Left Ear: External ear normal.  Nose: Nose normal.  Mouth/Throat: Oropharynx is clear and moist.  Eyes: Conjunctivae and EOM are normal. Pupils are equal, round, and reactive to light.  Neck: Normal range of motion. Neck supple. No JVD present. No tracheal deviation present.  Cardiovascular: Normal rate, regular rhythm,  normal heart sounds and intact distal pulses.   Pulmonary/Chest: Effort normal and breath sounds normal.  Abdominal: Soft. Bowel sounds are normal. There is no tenderness.  Musculoskeletal: Normal range of motion. Exhibits no edema.  Lymphadenopathy:  Has no cervical adenopathy.  Neurological: Pt is alert and oriented to person, place, and time. Pt has normal reflexes. No cranial nerve deficit.  Skin: Skin is warm and dry.  No rash noted.  Psychiatric:  Has  normal mood and affect. Behavior is normal.    Assessment & Plan:

## 2010-06-16 ENCOUNTER — Encounter: Payer: Self-pay | Admitting: Internal Medicine

## 2010-07-03 ENCOUNTER — Other Ambulatory Visit: Payer: Self-pay | Admitting: Internal Medicine

## 2010-07-12 ENCOUNTER — Other Ambulatory Visit: Payer: Self-pay | Admitting: Internal Medicine

## 2010-07-12 ENCOUNTER — Other Ambulatory Visit: Payer: Medicare Other

## 2010-07-12 DIAGNOSIS — E038 Other specified hypothyroidism: Secondary | ICD-10-CM

## 2010-07-12 DIAGNOSIS — E559 Vitamin D deficiency, unspecified: Secondary | ICD-10-CM

## 2010-07-18 ENCOUNTER — Other Ambulatory Visit (INDEPENDENT_AMBULATORY_CARE_PROVIDER_SITE_OTHER): Payer: Medicare Other

## 2010-07-18 ENCOUNTER — Ambulatory Visit (INDEPENDENT_AMBULATORY_CARE_PROVIDER_SITE_OTHER): Payer: Medicare Other | Admitting: Internal Medicine

## 2010-07-18 ENCOUNTER — Encounter: Payer: Self-pay | Admitting: Internal Medicine

## 2010-07-18 VITALS — BP 118/82 | HR 79 | Temp 98.0°F | Ht <= 58 in | Wt 133.1 lb

## 2010-07-18 DIAGNOSIS — E039 Hypothyroidism, unspecified: Secondary | ICD-10-CM

## 2010-07-18 DIAGNOSIS — F411 Generalized anxiety disorder: Secondary | ICD-10-CM

## 2010-07-18 DIAGNOSIS — M5412 Radiculopathy, cervical region: Secondary | ICD-10-CM | POA: Insufficient documentation

## 2010-07-18 DIAGNOSIS — I10 Essential (primary) hypertension: Secondary | ICD-10-CM

## 2010-07-18 DIAGNOSIS — IMO0002 Reserved for concepts with insufficient information to code with codable children: Secondary | ICD-10-CM

## 2010-07-18 HISTORY — DX: Radiculopathy, cervical region: M54.12

## 2010-07-18 MED ORDER — CYCLOBENZAPRINE HCL 5 MG PO TABS: 5.0000 mg | ORAL_TABLET | Freq: Three times a day (TID) | ORAL | Status: DC | PRN

## 2010-07-18 MED ORDER — PREDNISONE 10 MG PO TABS: 10.0000 mg | ORAL_TABLET | Freq: Every day | ORAL | Status: AC

## 2010-07-18 MED ORDER — TRAMADOL HCL 50 MG PO TABS: 50.0000 mg | ORAL_TABLET | Freq: Four times a day (QID) | ORAL | Status: DC | PRN

## 2010-07-18 NOTE — Patient Instructions (Signed)
Take all new medications as prescribed Continue all other medications as before Please call in 1-2 wks if not improved, as you may need to have the MRI done Please go to LAB in the Basement for the blood and/or urine tests to be done today Please call the number on the [facility] (the [employer] [facility]) for results of testing in 2-3 days Please followup in Sept 2012 as already planned

## 2010-07-18 NOTE — Progress Notes (Signed)
Subjective:    Patient ID: Julie Fritz, female    DOB: June 22, 1930, 77 y.o.   MRN: [id number]  HPI  Here to f/u wit 2 wks onset post neck pain, worse at the lower levels, burning and pins and like, worse in that it was also assoc with pain to the left shoulder and often radiation to the distal left arm but not quite to the left hand, without weakness; Pt continues to have recurring LBP as well without change in severity, bowel or bladder change, fever, wt loss,  worsening LE pain/numbness/weakness, gait change or falls.  Pt denies chest pain, increased sob or doe, wheezing, orthopnea, PND, increased LE swelling, palpitations, dizziness or syncope.  Pt denies new neurological symptoms such as new headache, or facial or extremity weakness or numbness   Denies worsening depressive symptoms, suicidal ideation, or panic, though has ongoing anxiety, not increased recently.  Denies hyper or hypo thyroid symptoms such as voice, skin or hair change.   Pt denies polydipsia, polyuria  Pt states overall good compliance with meds.  Has been taking the vitD in the past yr since found low last yr. Past Medical History  Diagnosis Date  . FHx: migraine headaches   . ALLERGIC RHINITIS   . Hx of colonic polyps     hx of adenoma last 7/09  . Hyperlipidemia   . Hypertension   . Osteoarthritis   . Peptic ulcer disease   . Hypothyroidism   . GERD (gastroesophageal reflux disease)   . Left knee DJD   . Right knee DJD   . Osteoporosis   . Ileocecal valve syndrome   . IBS (irritable bowel syndrome)   . Anxiety   . Depression   . Lumbar spondylosis   . ALLERGIC RHINITIS 12/03/2006  . ANXIETY 05/31/2008  . CARPAL TUNNEL SYNDROME, BILATERAL 06/03/2007  . COLONIC POLYPS, HX OF 12/03/2006  . DEPRESSION 05/31/2008  . GERD 03/16/2007  . HYPERLIPIDEMIA 12/03/2006  . HYPERTENSION 12/03/2006  . HYPOTHYROIDISM 12/03/2006  . Irritable bowel syndrome 09/15/2007  . MIGRAINE HEADACHE 12/03/2006  . OSTEOARTHRITIS 12/03/2006  .  OSTEOPOROSIS 03/16/2007  . PEPTIC ULCER DISEASE 12/03/2006  . Vitamin D deficiency 06/11/2010  . Cervical radicular pain 07/18/2010   Past Surgical History  Procedure Date  . Thyroidectomy     02/2004  . Appendectomy     1979  . Abdominal hysterectomy   . Tonsillectomy     1937  . Breast biopsy     1970  . Back surgery     1990  . Right hand surgery trigger surgury   . S/p right hand surgury/trigger surgury     Dr. Myerdierck    reports that she quit smoking about 6 years ago. Her smoking use included Cigarettes. She has quit using smokeless tobacco. She reports that she does not drink alcohol. Her drug history not on file. family history includes Dementia in her mother and Leukemia in her sister. Allergies  Allergen Reactions  . Tetracycline    Current Outpatient Prescriptions on File Prior to Visit  Medication Sig Dispense Refill  . atorvastatin (LIPITOR) 10 MG tablet TAKE 1 TABLET EVERY DAY  30 tablet  11  . cyclobenzaprine (FLEXERIL) 10 MG tablet Take 10 mg by mouth. 1/2 to 1 by mouth once daily prn       . fexofenadine (ALLEGRA) 180 MG tablet Take 180 mg by mouth daily.        . fluticasone (FLONASE) 50 MCG/ACT nasal spray 2 sprays  by Nasal route daily.        . lansoprazole (PREVACID) 30 MG capsule Take 30 mg by mouth daily.        Marland Kitchen levothyroxine (SYNTHROID) 50 MCG tablet Take 1 tablet (50 mcg total) by mouth daily.  30 tablet  11  . levothyroxine (SYNTHROID, LEVOTHROID) 50 MCG tablet Take 50 mcg by mouth daily.        Marland Kitchen lisinopril (PRINIVIL,ZESTRIL) 5 MG tablet Take 5 mg by mouth daily.        Marland Kitchen zolpidem (AMBIEN) 10 MG tablet Take 10 mg by mouth at bedtime as needed.        . ALPRAZolam (XANAX) 0.5 MG tablet Take 0.5 mg by mouth. 1 by mouth every 8 hours prn        Review of Systems All otherwise neg per pt     Objective:   Physical Exam BP 118/82  Pulse 79  Temp(Src) 98 F (36.7 C) (Oral)  Ht 4\' 9"  (1.448 m)  Wt 133 lb 2 oz (60.385 kg)  BMI 28.81 kg/m2   SpO2 93% Physical Exam  VS noted Constitutional: Pt appears well-developed and well-nourished.  HENT: Head: Normocephalic.  Right Ear: External ear normal.  Left Ear: External ear normal.  Eyes: Conjunctivae and EOM are normal. Pupils are equal, round, and reactive to light.  Neck: Normal range of motion. Neck supple.  Cardiovascular: Normal rate and regular rhythm.   Pulmonary/Chest: Effort normal and breath sounds normal.  Abd:  Soft, NT, non-distended, + BS Neurological: Pt is alert. No cranial nerve deficit.  motor, dtr's intact, gait normal, grip normal bilat, UE sensory intact Skin: Skin is warm. No erythema.  Psychiatric: Pt behavior is normal. Thought content normal.  1+ nervous, not depressed appearing       Assessment & Plan:

## 2010-07-18 NOTE — Assessment & Plan Note (Addendum)
Milder symptoms with benign exam;  Ok for predpack, pain med and flexeril prn, consdier MRI in 1-2 wks if not improved or worsened,  to f/u any worsening symptoms or concerns

## 2010-07-20 ENCOUNTER — Encounter: Payer: Self-pay | Admitting: Internal Medicine

## 2010-07-20 NOTE — Assessment & Plan Note (Addendum)
stable overall by hx and exam,  and pt declines to take xanax further, overall feels no need at this time

## 2010-07-20 NOTE — Assessment & Plan Note (Signed)
stable overall by hx and exam, most recent lab reviewed with pt, and pt to continue medical treatment as before  Lab Results  Component Value Date   TSH 9.36* 07/18/2010

## 2010-07-20 NOTE — Assessment & Plan Note (Addendum)
stable overall by hx and exam, most recent lab reviewed with pt, and pt to continue medical treatment as before  Lab Results  Component Value Date   WBC 7.7 05/30/2010   HGB 12.4 05/30/2010   HCT 36.5 05/30/2010   PLT 234.0 05/30/2010   CHOL 158 05/30/2010   TRIG 115.0 05/30/2010   HDL 54.50 05/30/2010   LDLDIRECT 121.8 05/31/2008   ALT 17 05/30/2010   AST 20 05/30/2010   NA 139 05/30/2010   K 4.5 05/30/2010   CL 107 05/30/2010   CREATININE 1.0 05/30/2010   BUN 19 05/30/2010   CO2 26 05/30/2010   TSH 9.36* 07/18/2010   BP Readings from Last 3 Encounters:  07/18/10 118/82  06/11/10 120/82  03/06/10 110/70

## 2010-08-02 ENCOUNTER — Ambulatory Visit (INDEPENDENT_AMBULATORY_CARE_PROVIDER_SITE_OTHER): Payer: Medicare Other | Admitting: Internal Medicine

## 2010-08-02 ENCOUNTER — Encounter: Payer: Self-pay | Admitting: Internal Medicine

## 2010-08-02 VITALS — BP 108/82 | HR 82 | Temp 98.0°F | Ht <= 58 in | Wt 134.2 lb

## 2010-08-02 DIAGNOSIS — I10 Essential (primary) hypertension: Secondary | ICD-10-CM

## 2010-08-02 DIAGNOSIS — IMO0002 Reserved for concepts with insufficient information to code with codable children: Secondary | ICD-10-CM

## 2010-08-02 DIAGNOSIS — F411 Generalized anxiety disorder: Secondary | ICD-10-CM

## 2010-08-02 DIAGNOSIS — M5412 Radiculopathy, cervical region: Secondary | ICD-10-CM

## 2010-08-02 MED ORDER — GABAPENTIN 100 MG PO CAPS: 100.0000 mg | ORAL_CAPSULE | Freq: Three times a day (TID) | ORAL | Status: DC

## 2010-08-02 NOTE — Progress Notes (Signed)
Subjective:    Patient ID: Julie Fritz, female    DOB: 04-29-1930, 77 y.o.   MRN: [id number]  HPI  Here to f/u , unfortunately with persistent worsening pain, and worsening numbness to the left postlat neck, with some radiation to the arm to just below the elbow, and somewhat to the left upper back area as well;  Somewhat worse with turning the neck, mild to mod, without LUE weakness or loss of grip strength.  Felt some initial improvement with the prednisone but minimal at best.  Pt denies chest pain, increased sob or doe, wheezing, orthopnea, PND, increased LE swelling, palpitations, dizziness or syncope. Pt denies new neurological symptoms such as new headache, or facial or extremity weakness or numbness except for the above.   Pt denies polydipsia, polyuria.  No bowel or bladder change, fever, wt loss,  worsening LE pain/numbness/weakness, gait change or falls. Denies worsening depressive symptoms, suicidal ideation, or panic, though has ongoing anxiety.   Past Medical History  Diagnosis Date  . FHx: migraine headaches   . ALLERGIC RHINITIS   . Hx of colonic polyps     hx of adenoma last 7/09  . Hyperlipidemia   . Hypertension   . Osteoarthritis   . Peptic ulcer disease   . Hypothyroidism   . GERD (gastroesophageal reflux disease)   . Left knee DJD   . Right knee DJD   . Osteoporosis   . Ileocecal valve syndrome   . IBS (irritable bowel syndrome)   . Anxiety   . Depression   . Lumbar spondylosis   . ALLERGIC RHINITIS 12/03/2006  . ANXIETY 05/31/2008  . CARPAL TUNNEL SYNDROME, BILATERAL 06/03/2007  . COLONIC POLYPS, HX OF 12/03/2006  . DEPRESSION 05/31/2008  . GERD 03/16/2007  . HYPERLIPIDEMIA 12/03/2006  . HYPERTENSION 12/03/2006  . HYPOTHYROIDISM 12/03/2006  . Irritable bowel syndrome 09/15/2007  . MIGRAINE HEADACHE 12/03/2006  . OSTEOARTHRITIS 12/03/2006  . OSTEOPOROSIS 03/16/2007  . PEPTIC ULCER DISEASE 12/03/2006  . Vitamin D deficiency 06/11/2010  . Cervical radicular pain  07/18/2010   Past Surgical History  Procedure Date  . Thyroidectomy     02/2004  . Appendectomy     1979  . Abdominal hysterectomy   . Tonsillectomy     1937  . Breast biopsy     1970  . Back surgery     1990  . Right hand surgery trigger surgury   . S/p right hand surgury/trigger surgury     Dr. Myerdierck    reports that she quit smoking about 6 years ago. Her smoking use included Cigarettes. She has quit using smokeless tobacco. She reports that she does not drink alcohol. Her drug history not on file. family history includes Dementia in her mother and Leukemia in her sister. Allergies  Allergen Reactions  . Tetracycline    Current Outpatient Prescriptions on File Prior to Visit  Medication Sig Dispense Refill  . atorvastatin (LIPITOR) 10 MG tablet TAKE 1 TABLET EVERY DAY  30 tablet  11  . cyclobenzaprine (FLEXERIL) 10 MG tablet Take 10 mg by mouth. 1/2 to 1 by mouth once daily prn       . cyclobenzaprine (FLEXERIL) 5 MG tablet Take 1 tablet (5 mg total) by mouth 3 (three) times daily as needed for muscle spasms.  60 tablet  1  . lansoprazole (PREVACID) 30 MG capsule Take 30 mg by mouth daily.        Marland Kitchen levothyroxine (SYNTHROID) 50 MCG tablet Take 1 tablet (  50 mcg total) by mouth daily.  30 tablet  11  . lisinopril (PRINIVIL,ZESTRIL) 5 MG tablet Take 5 mg by mouth daily.        . traMADol (ULTRAM) 50 MG tablet Take 1 tablet (50 mg total) by mouth every 6 (six) hours as needed for pain.  60 tablet  1  . fexofenadine (ALLEGRA) 180 MG tablet Take 180 mg by mouth daily.        . fluticasone (FLONASE) 50 MCG/ACT nasal spray 2 sprays by Nasal route daily.        Marland Kitchen zolpidem (AMBIEN) 10 MG tablet Take 10 mg by mouth at bedtime as needed.         Review of Systems All otherwise neg per pt     Objective:   Physical Exam BP 108/82  Pulse 82  Temp(Src) 98 F (36.7 C) (Oral)  Ht 4\' 9"  (1.448 m)  Wt 134 lb 4 oz (60.895 kg)  BMI 29.05 kg/m2  SpO2 92% Physical Exam  VS  noted Constitutional: Pt appears well-developed and well-nourished.  HENT: Head: Normocephalic.  Right Ear: External ear normal.  Left Ear: External ear normal.  Eyes: Conjunctivae and EOM are normal. Pupils are equal, round, and reactive to light.  Neck: Normal range of motion. Neck supple.  Cardiovascular: Normal rate and regular rhythm.   Pulmonary/Chest: Effort normal and breath sounds normal.  Neurological: Pt is alert. No cranial nerve deficit. Motor/dtr's/sens intact except for decreased sens to LT to left c4/c5 dermatomes Skin: Skin is warm. No erythema.  No rash Psychiatric: Pt behavior is normal. Thought content normal. 1+ nervous        Assessment & Plan:

## 2010-08-02 NOTE — Patient Instructions (Signed)
Take all new medications as prescribed Continue all other medications as before You will be contacted regarding the referral for: MRI for the neck, and Dr Wynetta Emery

## 2010-08-02 NOTE — Assessment & Plan Note (Signed)
stable overall by hx and exam, most recent lab reviewed with pt, and pt to continue medical treatment as before BP Readings from Last 3 Encounters:  08/02/10 108/82  07/18/10 118/82  06/11/10 120/82

## 2010-08-02 NOTE — Assessment & Plan Note (Signed)
stable overall by hx and exam, most recent lab reviewed with pt, and pt to continue medical treatment as before Lab Results  Component Value Date   WBC 7.7 05/30/2010   HGB 12.4 05/30/2010   HCT 36.5 05/30/2010   PLT 234.0 05/30/2010   CHOL 158 05/30/2010   TRIG 115.0 05/30/2010   HDL 54.50 05/30/2010   LDLDIRECT 121.8 05/31/2008   ALT 17 05/30/2010   AST 20 05/30/2010   NA 139 05/30/2010   K 4.5 05/30/2010   CL 107 05/30/2010   CREATININE 1.0 05/30/2010   BUN 19 05/30/2010   CO2 26 05/30/2010   TSH 9.36* 07/18/2010

## 2010-08-02 NOTE — Assessment & Plan Note (Signed)
Left sided, worse despite steroid tx, with abnormal exam now with decreased sensation to left C4/c5  - will start neurontin 100 tid, refer MRI c-spine and refer Dr Cram/NS

## 2010-08-06 NOTE — Op Note (Signed)
[name], Julie Fritz               ACCOUNT NO.:  [id number]   MEDICAL RECORD NO.:  [id number]          PATIENT TYPE:  AMB   LOCATION:  DSC                          FACILITY:  [facility]   PHYSICIAN:  Tennis Must Meyerdierks, M.D.DATE OF BIRTH:  12-Jul-1930   DATE OF PROCEDURE:  DATE OF DISCHARGE:                               OPERATIVE REPORT   PREOPERATIVE DIAGNOSIS:  Mass, right palm.   POSTOPERATIVE DIAGNOSIS:  Mass, right palm.   PROCEDURE:  Excision of mass right palm with release of A1 pulley right  small finger.   OPERATIVE FINDINGS:  The patient had a blue colored mass that was  overlying the volar aspect of the MP joint of the small finger.  There  were black speckles in the mass.   PROCEDURE:  Under general anesthesia with a tourniquet on the right arm,  the right hand was prepped and draped in the usual fashion, and after  exsanguinating the limb, the tourniquet was inflated to 250 mmHg.  A V-  shaped incision was made overlying the mass in the right small finger in  the palm.  It was sharply dissected through the subcutaneous tissues.  Blunt dissection was carried down to the mass and it was bluntly  dissected out.  It appeared to be blue in color and soft.  After  completely excising the mass, the A1 pulley of the small finger was  released.  The wound was irrigated with saline.  The skin edges were  injected with 0.5% Marcaine for pain control.  The wound was closed with  4-0 nylon sutures.  Sterile dressings were applied.  The tourniquet was  released with good circulation to the hand.  The patient went to the  recovery room, awake and stable in good condition.      Lowell Bouton, M.D.  Electronically Signed     EMM/MEDQ  D:  11/10/2007  T:  11/11/2007  Job:  [id number]

## 2010-08-09 NOTE — Op Note (Signed)
Julie Fritz, Julie Fritz               ACCOUNT NO.:  [id number]   MEDICAL RECORD NO.:  [id number]          PATIENT TYPE:  AMB   LOCATION:  DAY                          FACILITY:  [facility]   PHYSICIAN:  Velora Heckler, MD      DATE OF BIRTH:  March 02, 1931   DATE OF PROCEDURE:  03/07/2004  DATE OF DISCHARGE:                                 OPERATIVE REPORT   PREOPERATIVE DIAGNOSES:  1.  Multinodular goiter with Hurthle cell change.  2.  Atypical nevus left breast.   POSTOPERATIVE DIAGNOSES:  1.  Multinodular goiter with Hurthle cell change.  2.  Atypical nevus left breast.   PROCEDURE:  1.  Total thyroidectomy.  2.  Excise atypical nevus left breast (1.5 cm).   SURGEON:  Velora Heckler, MD.   ASSISTANT:  Gabrielle Dare. Janee Morn, M.D.   ANESTHESIA:  General.   ESTIMATED BLOOD LOSS:  Minimal.   PREPARATION:  Betadine.   COMPLICATIONS:  None.   INDICATIONS FOR PROCEDURE:  The patient is a 77 year old female who presents  with newly diagnosed Hurthle cell neoplasm of the thyroid. The patient had  been seen by Dr. Shaune Pollack in September 2005. The patient had undergone  thyroid ultrasound December 22, 2003.  This showed a low attenuation lesion  in the isthmus of the thyroid. She underwent fine needle aspiration showing  Hurthle cell change.  The patient also noted an atypical nevus on the upper  lateral left breast. This has increased in size recently. She desires  excision. The patient now comes to surgery for a total thyroidectomy and  excision of atypical nevus.   DESCRIPTION OF PROCEDURE:  The procedure was done in OR #11 at the [facility].  The patient was brought to the operating room,  placed in the supine position on the operating room table.  Following the  administration of general anesthesia, the patient is positioned and then  prepped and draped in the usual strict aseptic fashion. After ascertaining  that an adequate level of anesthesia had been  obtained, a Kocher incision is  made with a #15 blade. Dissection was carried through the subcutaneous  tissues and platysma.  Hemostasis is obtained with the electrocautery.  The  external jugular veins are ligated with 2-0 silk ligatures.  Skin flaps are  developed cephalad and caudad from the thyroid notch to the sternal notch. A  Mahorner self retaining retractor is placed for exposure. Strap muscles are  incised in the midline, dissection is begun on the left side of the neck.  The thyroid gland is small, firm and multinodular.  The venous tributaries  are divided between small and medium ligaclips. The gland is mobilized  laterally and superiorly. The superior pole vessels are ligated in  continuity with 2-0 silk ties and medium ligaclips and divided. The inferior  venous tributaries are divided between small ligaclips.  The branches of the  inferior thyroid artery are divided between small ligaclips. The gland is  rolled medially. The parathyroid tissue and recurrent laryngeal nerve are  preserved. The ligament of Allyson Sabal is transected  with the electrocautery and  the gland is rolled up and onto the anterior trachea. A small pyramidal lobe  is dissected out with the electrocautery and included with the specimen.   Next, we turned our attention to the right side of the neck.  Again the  strap muscles are reflected laterally. Venous tributaries are divided  between small ligaclips.  The inferior pole is mobilized. The parathyroid  tissue is identified and preserved.  Venous tributaries are divided between  small ligaclips. The superior pole vessels on the right are ligated in  continuity with 2-0 silk ties and medium ligaclips and divided.  The gland  is rolled anteriorly. The branches of the inferior thyroid artery are  divided between small ligaclips.  The ligament of Allyson Sabal is transected.  The  recurrent laryngeal nerve is preserved. The gland is rolled up and onto the  anterior  trachea where it is excised completely.  Sutures used to mark the  left superior pole. The entire gland is submitted to pathology for review.  Two lymph nodes were previous dissected out of the central compartment and  submitted separately to pathology for review as well.  Good hemostasis is  noted on both sides of the neck.  The neck is irrigated with warm saline.  Surgicel was placed over the area of the current laryngeal nerve and  parathyroid glands bilaterally.  The strap muscles are reapproximated in the  midline with interrupted 3-0 Vicryl sutures. The platysma was closed with  interrupted 3-0 Vicryl sutures.  The skin edges are reapproximated with  running 4-0 Vicryl subcuticular suture.  The wound is washed and dried and  Benzoin and Steri-Strips are applied. Sterile dressings are applied.   Next, we turned our attention to the atypical nevus on the left breast.  Additional Betadine spray is placed as preparation.  Using a #15 blade, an  elliptical incision is made so as to excise the entire nevus fullthickness  into the underlying subcutaneous tissues.  Hemostasis is obtained with the  electrocautery.  The specimen is submitted to pathology.  Skin edges are  reapproximated with interrupted 4-0 Vicryl subcuticular sutures.  The wound  is washed and dried and Benzoin and Steri-Strips are applied.  Sterile  dressings are applied. The patient is awakened from anesthesia and brought  to the recovery room in stable condition.  The patient tolerated the  procedure well.     Todd   TMG/MEDQ  D:  03/07/2004  T:  03/07/2004  Job:  [id number]   cc:   Duncan Dull, M.D.  [address]  [place]  [place] [address]  Fax: [phone]   Nadyne Coombes. Audie Box, M.D.  [address]  [place]  [place] [address]  Fax: [phone]

## 2010-08-15 ENCOUNTER — Ambulatory Visit (HOSPITAL_COMMUNITY)
Admission: RE | Admit: 2010-08-15 | Discharge: 2010-08-15 | Disposition: A | Discharge status: Home / Self Care | Payer: Medicare Other | Source: Ambulatory Visit | Attending: Internal Medicine | Admitting: Internal Medicine

## 2010-08-15 DIAGNOSIS — R209 Unspecified disturbances of skin sensation: Secondary | ICD-10-CM | POA: Insufficient documentation

## 2010-08-15 DIAGNOSIS — M25519 Pain in unspecified shoulder: Secondary | ICD-10-CM | POA: Insufficient documentation

## 2010-08-15 DIAGNOSIS — G319 Degenerative disease of nervous system, unspecified: Secondary | ICD-10-CM | POA: Insufficient documentation

## 2010-08-15 DIAGNOSIS — M5030 Other cervical disc degeneration, unspecified cervical region: Secondary | ICD-10-CM | POA: Insufficient documentation

## 2010-08-15 DIAGNOSIS — M5412 Radiculopathy, cervical region: Secondary | ICD-10-CM

## 2010-08-15 DIAGNOSIS — M47812 Spondylosis without myelopathy or radiculopathy, cervical region: Secondary | ICD-10-CM | POA: Insufficient documentation

## 2010-10-09 ENCOUNTER — Ambulatory Visit (INDEPENDENT_AMBULATORY_CARE_PROVIDER_SITE_OTHER): Payer: Medicare Other | Admitting: Internal Medicine

## 2010-10-09 ENCOUNTER — Encounter: Payer: Self-pay | Admitting: Internal Medicine

## 2010-10-09 VITALS — BP 100/80 | HR 69 | Temp 97.9°F | Wt 137.5 lb

## 2010-10-09 DIAGNOSIS — I10 Essential (primary) hypertension: Secondary | ICD-10-CM

## 2010-10-09 DIAGNOSIS — F411 Generalized anxiety disorder: Secondary | ICD-10-CM

## 2010-10-09 DIAGNOSIS — M549 Dorsalgia, unspecified: Secondary | ICD-10-CM

## 2010-10-09 MED ORDER — CYCLOBENZAPRINE HCL 5 MG PO TABS: 5.0000 mg | ORAL_TABLET | Freq: Three times a day (TID) | ORAL | Status: AC | PRN

## 2010-10-09 NOTE — Progress Notes (Signed)
Subjective:    Patient ID: Julie Fritz, female    DOB: 11/30/1930, 77 y.o.   MRN: [id number]  HPI here with acute onset x 3 days pain to the left periscapular area, worse to move the left shoulder, nonpleuritic and nonexertional,  Started after visiting with grandkids and picking them up; neck pain has resolved and no radicular symptoms since tx last visit;  Pain to the left back acutally much improved today but husband urged her to come in anyway;  Pt denies chest pain, increased sob or doe, wheezing, orthopnea, PND, increased LE swelling, palpitations, dizziness or syncope.  Pt denies new neurological symptoms such as new headache, or facial or extremity weakness or numbness   Pt denies polydipsia, polyuria.  Denies worsening depressive symptoms, suicidal ideation, or panic, though has ongoing anxiety, not increased recently.  Past Medical History  Diagnosis Date  . FHx: migraine headaches   . ALLERGIC RHINITIS   . Hx of colonic polyps     hx of adenoma last 7/09  . Hyperlipidemia   . Hypertension   . Osteoarthritis   . Peptic ulcer disease   . Hypothyroidism   . GERD (gastroesophageal reflux disease)   . Left knee DJD   . Right knee DJD   . Osteoporosis   . Ileocecal valve syndrome   . IBS (irritable bowel syndrome)   . Anxiety   . Depression   . Lumbar spondylosis   . ALLERGIC RHINITIS 12/03/2006  . ANXIETY 05/31/2008  . CARPAL TUNNEL SYNDROME, BILATERAL 06/03/2007  . COLONIC POLYPS, HX OF 12/03/2006  . DEPRESSION 05/31/2008  . GERD 03/16/2007  . HYPERLIPIDEMIA 12/03/2006  . HYPERTENSION 12/03/2006  . HYPOTHYROIDISM 12/03/2006  . Irritable bowel syndrome 09/15/2007  . MIGRAINE HEADACHE 12/03/2006  . OSTEOARTHRITIS 12/03/2006  . OSTEOPOROSIS 03/16/2007  . PEPTIC ULCER DISEASE 12/03/2006  . Vitamin D deficiency 06/11/2010  . Cervical radicular pain 07/18/2010   Past Surgical History  Procedure Date  . Thyroidectomy     02/2004  . Appendectomy     1979  . Abdominal hysterectomy    . Tonsillectomy     1937  . Breast biopsy     1970  . Back surgery     1990  . Right hand surgery trigger surgury   . S/p right hand surgury/trigger surgury     Dr. Myerdierck    reports that she quit smoking about 6 years ago. Her smoking use included Cigarettes. She has quit using smokeless tobacco. She reports that she does not drink alcohol. Her drug history not on file. family history includes Dementia in her mother and Leukemia in her sister. Allergies  Allergen Reactions  . Tetracycline    Current Outpatient Prescriptions on File Prior to Visit  Medication Sig Dispense Refill  . atorvastatin (LIPITOR) 10 MG tablet TAKE 1 TABLET EVERY DAY  30 tablet  11  . gabapentin (NEURONTIN) 100 MG capsule Take 1 capsule (100 mg total) by mouth 3 (three) times daily.  90 capsule  2  . lansoprazole (PREVACID) 30 MG capsule Take 30 mg by mouth daily.        Marland Kitchen levothyroxine (SYNTHROID) 50 MCG tablet Take 1 tablet (50 mcg total) by mouth daily.  30 tablet  11  . traMADol (ULTRAM) 50 MG tablet Take 1 tablet (50 mg total) by mouth every 6 (six) hours as needed for pain.  60 tablet  1   Review of Systems Review of Systems  Constitutional: Negative for diaphoresis and unexpected  weight change.  HENT: Negative for drooling and tinnitus.   Eyes: Negative for photophobia and visual disturbance.  Respiratory: Negative for choking and stridor.   Gastrointestinal: Negative for vomiting and blood in stool.  Genitourinary: Negative for hematuria and decreased urine volume.  Musculoskeletal: Negative for gait problem.  Skin: Negative for color change and wound.  Neurological: Negative for tremors and numbness.        Objective:   Physical Exam BP 100/80  Pulse 69  Temp(Src) 97.9 F (36.6 C) (Oral)  Wt 137 lb 8 oz (62.37 kg)  SpO2 94% Physical Exam  VS noted Constitutional: Pt appears well-developed and well-nourished.  HENT: Head: Normocephalic.  Right Ear: External ear normal.  Left Ear:  External ear normal.  Eyes: Conjunctivae and EOM are normal. Pupils are equal, round, and reactive to light.  Neck: Normal range of motion. Neck supple.  Cardiovascular: Normal rate and regular rhythm.   Pulmonary/Chest: Effort normal and breath sounds normal.  Abd:  Soft, NT, non-distended, + BS Neurological: Pt is alert. No cranial nerve deficit.  Skin: Skin is warm. No erythema.  Psychiatric: Pt behavior is normal. Thought content normal.  Has some mild left periscapular tender/rhomboid tender ; spine nontender , no swelling or rash       Assessment & Plan:

## 2010-10-09 NOTE — Patient Instructions (Addendum)
Take all new medications as prescribed - the 5 mg flexeril as needed Continue all other medications as before

## 2010-10-09 NOTE — Assessment & Plan Note (Signed)
Left periscapular  Area due to MSK strain - for flexeril prn, o/w  to f/u any worsening symptoms or concerns

## 2010-10-09 NOTE — Assessment & Plan Note (Signed)
stable overall by hx and exam, most recent data reviewed with pt, and pt to continue medical treatment as before  BP Readings from Last 3 Encounters:  10/09/10 100/80  08/02/10 108/82  07/18/10 118/82

## 2010-10-09 NOTE — Assessment & Plan Note (Signed)
stable overall by hx and exam, most recent data reviewed with pt, and pt to continue medical treatment as before  Lab Results  Component Value Date   WBC 7.7 05/30/2010   HGB 12.4 05/30/2010   HCT 36.5 05/30/2010   PLT 234.0 05/30/2010   CHOL 158 05/30/2010   TRIG 115.0 05/30/2010   HDL 54.50 05/30/2010   LDLDIRECT 121.8 05/31/2008   ALT 17 05/30/2010   AST 20 05/30/2010   NA 139 05/30/2010   K 4.5 05/30/2010   CL 107 05/30/2010   CREATININE 1.0 05/30/2010   BUN 19 05/30/2010   CO2 26 05/30/2010   TSH 9.36* 07/18/2010    

## 2010-11-11 ENCOUNTER — Ambulatory Visit (INDEPENDENT_AMBULATORY_CARE_PROVIDER_SITE_OTHER): Payer: Medicare Other | Admitting: Internal Medicine

## 2010-11-11 ENCOUNTER — Encounter: Payer: Self-pay | Admitting: Internal Medicine

## 2010-11-11 VITALS — BP 112/72 | HR 81 | Temp 98.4°F | Ht 59.0 in | Wt 137.0 lb

## 2010-11-11 DIAGNOSIS — J309 Allergic rhinitis, unspecified: Secondary | ICD-10-CM

## 2010-11-11 DIAGNOSIS — J069 Acute upper respiratory infection, unspecified: Secondary | ICD-10-CM

## 2010-11-11 DIAGNOSIS — F411 Generalized anxiety disorder: Secondary | ICD-10-CM

## 2010-11-11 DIAGNOSIS — I10 Essential (primary) hypertension: Secondary | ICD-10-CM

## 2010-11-11 MED ORDER — AZITHROMYCIN 250 MG PO TABS: ORAL_TABLET | ORAL | Status: AC

## 2010-11-11 MED ORDER — FLUTICASONE PROPIONATE 50 MCG/ACT NA SUSP: 2.0000 | Freq: Every day | NASAL | Status: AC

## 2010-11-11 NOTE — Patient Instructions (Addendum)
Take all new medications as prescribed Continue all other medications as before  

## 2010-11-11 NOTE — Assessment & Plan Note (Signed)
Mild to mod, to re-start allergy meds with flonase adn OTC allegra prn,  to f/u any worsening symptoms or concerns

## 2010-11-11 NOTE — Assessment & Plan Note (Signed)
Mild to mod, for antibx course,  to f/u any worsening symptoms or concerns 

## 2010-11-11 NOTE — Progress Notes (Signed)
Subjective:    Patient ID: Julie Fritz, female    DOB: 01/31/1931, 77 y.o.   MRN: [id number]  HPI   Here with 3 days acute onset fever, facial  Pressure and congstion with colored d/c, general weakness and malaise, and greenish d/c, with slight ST, but little to no cough and Pt denies chest pain, increased sob or doe, wheezing, orthopnea, PND, increased LE swelling, palpitations, dizziness or syncope. All started after visiting family in [place].  Pt denies new neurological symptoms such as new headache, or facial or extremity weakness or numbness   Pt denies polydipsia, polyuria.   Denies worsening depressive symptoms, suicidal ideation, or panic, though has ongoing anxiety, not increased recently.  Also - Does have several wks ongoing nasal allergy symptoms with clear congestion, itch and sneeze, without fever, pain, ST, cough or wheezing. Past Medical History  Diagnosis Date  . FHx: migraine headaches   . ALLERGIC RHINITIS   . Hx of colonic polyps     hx of adenoma last 7/09  . Hyperlipidemia   . Hypertension   . Osteoarthritis   . Peptic ulcer disease   . Hypothyroidism   . GERD (gastroesophageal reflux disease)   . Left knee DJD   . Right knee DJD   . Osteoporosis   . Ileocecal valve syndrome   . IBS (irritable bowel syndrome)   . Anxiety   . Depression   . Lumbar spondylosis   . ALLERGIC RHINITIS 12/03/2006  . ANXIETY 05/31/2008  . CARPAL TUNNEL SYNDROME, BILATERAL 06/03/2007  . COLONIC POLYPS, HX OF 12/03/2006  . DEPRESSION 05/31/2008  . GERD 03/16/2007  . HYPERLIPIDEMIA 12/03/2006  . HYPERTENSION 12/03/2006  . HYPOTHYROIDISM 12/03/2006  . Irritable bowel syndrome 09/15/2007  . MIGRAINE HEADACHE 12/03/2006  . OSTEOARTHRITIS 12/03/2006  . OSTEOPOROSIS 03/16/2007  . PEPTIC ULCER DISEASE 12/03/2006  . Vitamin D deficiency 06/11/2010  . Cervical radicular pain 07/18/2010   Past Surgical History  Procedure Date  . Thyroidectomy     02/2004  . Appendectomy     1979  . Abdominal  hysterectomy   . Tonsillectomy     1937  . Breast biopsy     1970  . Back surgery     1990  . Right hand surgery trigger surgury   . S/p right hand surgury/trigger surgury     Dr. Myerdierck    reports that she quit smoking about 6 years ago. Her smoking use included Cigarettes. She has quit using smokeless tobacco. She reports that she does not drink alcohol. Her drug history not on file. family history includes Dementia in her mother and Leukemia in her sister. Allergies  Allergen Reactions  . Tetracycline    Current Outpatient Prescriptions on File Prior to Visit  Medication Sig Dispense Refill  . atorvastatin (LIPITOR) 10 MG tablet TAKE 1 TABLET EVERY DAY  30 tablet  11  . gabapentin (NEURONTIN) 100 MG capsule Take 1 capsule (100 mg total) by mouth 3 (three) times daily.  90 capsule  2  . lansoprazole (PREVACID) 30 MG capsule Take 30 mg by mouth daily.        Marland Kitchen levothyroxine (SYNTHROID) 50 MCG tablet Take 1 tablet (50 mcg total) by mouth daily.  30 tablet  11  . traMADol (ULTRAM) 50 MG tablet Take 1 tablet (50 mg total) by mouth every 6 (six) hours as needed for pain.  60 tablet  1   Review of Systems Review of Systems  Constitutional: Negative for diaphoresis and  unexpected weight change.  HENT: Negative for drooling and tinnitus.   Eyes: Negative for photophobia and visual disturbance.  Respiratory: Negative for choking and stridor.   Gastrointestinal: Negative for vomiting and blood in stool.  Genitourinary: Negative for hematuria and decreased urine volume.    Objective:   Physical Exam BP 112/72  Pulse 81  Temp(Src) 98.4 F (36.9 C) (Oral)  Ht 4\' 11"  (1.499 m)  Wt 137 lb (62.143 kg)  BMI 27.67 kg/m2  SpO2 96% Physical Exam  VS noted, mild ill appaering Constitutional: Pt appears well-developed and well-nourished.  HENT: Head: Normocephalic.  Right Ear: External ear normal.  Left Ear: External ear normal.  Bilat tm's mild erythema.  Sinus nontender.  Pharynx  severe erythema Eyes: Conjunctivae and EOM are normal. Pupils are equal, round, and reactive to light.  Neck: Normal range of motion. Neck supple.  Cardiovascular: Normal rate and regular rhythm.   Pulmonary/Chest: Effort normal and breath sounds normal.  Neurological: Pt is alert. No cranial nerve deficit.  Skin: Skin is warm. No erythema.  Psychiatric: Pt behavior is normal. Thought content normal.1+ nervous         Assessment & Plan:

## 2010-11-11 NOTE — Assessment & Plan Note (Signed)
stable overall by hx and exam, most recent data reviewed with pt, and pt to continue medical treatment as before  BP Readings from Last 3 Encounters:  11/11/10 112/72  10/09/10 100/80  08/02/10 108/82

## 2010-11-11 NOTE — Assessment & Plan Note (Signed)
stable overall by hx and exam, most recent data reviewed with pt, and pt to continue medical treatment as before  Lab Results  Component Value Date   WBC 7.7 05/30/2010   HGB 12.4 05/30/2010   HCT 36.5 05/30/2010   PLT 234.0 05/30/2010   CHOL 158 05/30/2010   TRIG 115.0 05/30/2010   HDL 54.50 05/30/2010   LDLDIRECT 121.8 05/31/2008   ALT 17 05/30/2010   AST 20 05/30/2010   NA 139 05/30/2010   K 4.5 05/30/2010   CL 107 05/30/2010   CREATININE 1.0 05/30/2010   BUN 19 05/30/2010   CO2 26 05/30/2010   TSH 9.36* 07/18/2010    

## 2010-11-28 ENCOUNTER — Telehealth: Payer: Self-pay | Admitting: *Deleted

## 2010-11-28 NOTE — Telephone Encounter (Signed)
Pt called wanting to know when last bone density had been done. Pt informed last bone density done in 2009.

## 2010-12-12 ENCOUNTER — Ambulatory Visit: Payer: Medicare Other | Admitting: Internal Medicine

## 2010-12-17 ENCOUNTER — Encounter: Payer: Self-pay | Admitting: Internal Medicine

## 2010-12-24 ENCOUNTER — Other Ambulatory Visit (INDEPENDENT_AMBULATORY_CARE_PROVIDER_SITE_OTHER): Payer: Medicare Other

## 2010-12-24 ENCOUNTER — Encounter: Payer: Self-pay | Admitting: Internal Medicine

## 2010-12-24 ENCOUNTER — Ambulatory Visit (INDEPENDENT_AMBULATORY_CARE_PROVIDER_SITE_OTHER): Payer: Medicare Other | Admitting: Internal Medicine

## 2010-12-24 ENCOUNTER — Other Ambulatory Visit: Payer: Self-pay | Admitting: Internal Medicine

## 2010-12-24 VITALS — BP 112/80 | HR 73 | Temp 97.8°F | Ht 59.0 in | Wt 138.5 lb

## 2010-12-24 DIAGNOSIS — E039 Hypothyroidism, unspecified: Secondary | ICD-10-CM

## 2010-12-24 DIAGNOSIS — I10 Essential (primary) hypertension: Secondary | ICD-10-CM

## 2010-12-24 DIAGNOSIS — Z23 Encounter for immunization: Secondary | ICD-10-CM

## 2010-12-24 DIAGNOSIS — E785 Hyperlipidemia, unspecified: Secondary | ICD-10-CM

## 2010-12-24 DIAGNOSIS — Z0000 Encounter for general adult medical examination without abnormal findings: Secondary | ICD-10-CM

## 2010-12-24 DIAGNOSIS — F329 Major depressive disorder, single episode, unspecified: Secondary | ICD-10-CM

## 2010-12-24 LAB — LIPID PANEL
Total CHOL/HDL Ratio: 4
Triglycerides: 206 mg/dL — ABNORMAL HIGH (ref 0.0–149.0)

## 2010-12-24 LAB — LDL CHOLESTEROL, DIRECT: Direct LDL: 119.1 mg/dL

## 2010-12-24 MED ORDER — PNEUMOCOCCAL VAC POLYVALENT 25 MCG/0.5ML IJ INJ: 0.5000 mL | INJECTION | Freq: Once | INTRAMUSCULAR | Status: DC

## 2010-12-24 MED ORDER — LANSOPRAZOLE 30 MG PO CPDR: 30.0000 mg | DELAYED_RELEASE_CAPSULE | Freq: Every day | ORAL | Status: DC

## 2010-12-24 MED ORDER — LEVOTHYROXINE SODIUM 75 MCG PO TABS: 75.0000 ug | ORAL_TABLET | Freq: Every day | ORAL | Status: DC

## 2010-12-24 NOTE — Assessment & Plan Note (Signed)
stable overall by hx and exam, most recent data reviewed with pt, and pt to continue medical treatment as before ble  Lab Results  Component Value Date   LDLCALC 81 05/30/2010   \

## 2010-12-24 NOTE — Assessment & Plan Note (Signed)
With recent wt gain, for repeat tsh today -  to f/u any worsening symptoms or concerns

## 2010-12-24 NOTE — Assessment & Plan Note (Signed)
stable overall by hx and exam, most recent data reviewed with pt, and pt to continue medical treatment as before  Lab Results  Component Value Date   WBC 7.7 05/30/2010   HGB 12.4 05/30/2010   HCT 36.5 05/30/2010   PLT 234.0 05/30/2010   CHOL 158 05/30/2010   TRIG 115.0 05/30/2010   HDL 54.50 05/30/2010   LDLDIRECT 121.8 05/31/2008   ALT 17 05/30/2010   AST 20 05/30/2010   NA 139 05/30/2010   K 4.5 05/30/2010   CL 107 05/30/2010   CREATININE 1.0 05/30/2010   BUN 19 05/30/2010   CO2 26 05/30/2010   TSH 9.36* 07/18/2010

## 2010-12-24 NOTE — Progress Notes (Signed)
Subjective:    Patient ID: Julie Fritz, female    DOB: 12-Feb-1931, 77 y.o.   MRN: [id number]  HPI  Here to f/u; overall doing ok, now past the infection from last visit after visiting her daughter living there with he 6 chldren, and plans to visit again soon the grandchild in [place] who is sick again with ? Viral pneumonia.  Wanted flu shot and pnuemonia shot.  Denies hyper or hypo thyroid symptoms such as voice, skin or hair change, but has gained wt.  Pt denies chest pain, increased sob or doe, wheezing, orthopnea, PND, increased LE swelling, palpitations, dizziness or syncope .  Pt denies new neurological symptoms such as new headache, or facial or extremity weakness or numbness   Pt denies polydipsia, polyuria.  Pt denies fever, wt loss, night sweats, loss of appetite, or other constitutional symptoms.  Denies worsening depressive symptoms, suicidal ideation, or panic, though has ongoing anxiety, not increased recently per pt. Past Medical History  Diagnosis Date  . FHx: migraine headaches   . ALLERGIC RHINITIS   . Hx of colonic polyps     hx of adenoma last 7/09  . Hyperlipidemia   . Hypertension   . Osteoarthritis   . Peptic ulcer disease   . Hypothyroidism   . GERD (gastroesophageal reflux disease)   . Left knee DJD   . Right knee DJD   . Osteoporosis   . Ileocecal valve syndrome   . IBS (irritable bowel syndrome)   . Anxiety   . Depression   . Lumbar spondylosis   . ALLERGIC RHINITIS 12/03/2006  . ANXIETY 05/31/2008  . CARPAL TUNNEL SYNDROME, BILATERAL 06/03/2007  . COLONIC POLYPS, HX OF 12/03/2006  . DEPRESSION 05/31/2008  . GERD 03/16/2007  . HYPERLIPIDEMIA 12/03/2006  . HYPERTENSION 12/03/2006  . HYPOTHYROIDISM 12/03/2006  . Irritable bowel syndrome 09/15/2007  . MIGRAINE HEADACHE 12/03/2006  . OSTEOARTHRITIS 12/03/2006  . OSTEOPOROSIS 03/16/2007  . PEPTIC ULCER DISEASE 12/03/2006  . Vitamin D deficiency 06/11/2010  . Cervical radicular pain 07/18/2010   Past Surgical  History  Procedure Date  . Thyroidectomy     02/2004  . Appendectomy     1979  . Abdominal hysterectomy   . Tonsillectomy     1937  . Breast biopsy     1970  . Back surgery     1990  . Right hand surgery trigger surgury   . S/p right hand surgury/trigger surgury     Dr. Myerdierck    reports that she quit smoking about 6 years ago. Her smoking use included Cigarettes. She has quit using smokeless tobacco. She reports that she does not drink alcohol. Her drug history not on file. family history includes Dementia in her mother and Leukemia in her sister. Allergies  Allergen Reactions  . Tetracycline    Current Outpatient Prescriptions on File Prior to Visit  Medication Sig Dispense Refill  . atorvastatin (LIPITOR) 10 MG tablet TAKE 1 TABLET EVERY DAY  30 tablet  11  . fluticasone (FLONASE) 50 MCG/ACT nasal spray Place 2 sprays into the nose daily.  16 g  2  . gabapentin (NEURONTIN) 100 MG capsule Take 1 capsule (100 mg total) by mouth 3 (three) times daily.  90 capsule  2  . lansoprazole (PREVACID) 30 MG capsule Take 30 mg by mouth daily.        Marland Kitchen levothyroxine (SYNTHROID) 50 MCG tablet Take 1 tablet (50 mcg total) by mouth daily.  30 tablet  11  .  traMADol (ULTRAM) 50 MG tablet Take 1 tablet (50 mg total) by mouth every 6 (six) hours as needed for pain.  60 tablet  1   No current facility-administered medications on file prior to visit.   Review of Systems Review of Systems  Constitutional: Negative for diaphoresis and unexpected weight change.  HENT: Negative for drooling and tinnitus.   Eyes: Negative for photophobia and visual disturbance.  Respiratory: Negative for choking and stridor.   Gastrointestinal: Negative for vomiting and blood in stool.  Genitourinary: Negative for hematuria and decreased urine volume.  Objective:   Physical Exam BP 112/80  Pulse 73  Temp(Src) 97.8 F (36.6 C) (Oral)  Ht 4\' 11"  (1.499 m)  Wt 138 lb 8 oz (62.823 kg)  BMI 27.97 kg/m2   SpO2 97% Physical Exam  VS noted, not ill appearing Constitutional: Pt appears well-developed and well-nourished.  HENT: Head: Normocephalic.  Right Ear: External ear normal.  Left Ear: External ear normal.  Eyes: Conjunctivae and EOM are normal. Pupils are equal, round, and reactive to light.  Neck: Normal range of motion. Neck supple.  Cardiovascular: Normal rate and regular rhythm.   Pulmonary/Chest: Effort normal and breath sounds normal.  Neurological: Pt is alert. No cranial nerve deficit.  Skin: Skin is warm. No erythema.  Psychiatric: Pt behavior is normal. Thought content normal. 1+ nervous    Assessment & Plan:

## 2010-12-24 NOTE — Assessment & Plan Note (Signed)
stable overall by hx and exam, most recent data reviewed with pt, and pt to continue medical treatment as before  BP Readings from Last 3 Encounters:  12/24/10 112/80  11/11/10 112/72  10/09/10 100/80

## 2010-12-24 NOTE — Patient Instructions (Addendum)
You had the flu shot and the pneumonia shot Continue all other medications as before Please go to LAB in the Basement for the blood and/or urine tests to be done today Please call the phone number [phone] (the [employer] System) for results of testing in 2-3 days;  When calling, simply dial the number, and when prompted enter the MRN number above (the Medical Record Number) and the # key, then the message should start. Please return in 6 mo with Lab testing done 3-5 days before

## 2011-02-24 DIAGNOSIS — M1990 Unspecified osteoarthritis, unspecified site: Secondary | ICD-10-CM | POA: Insufficient documentation

## 2011-02-24 DIAGNOSIS — K219 Gastro-esophageal reflux disease without esophagitis: Secondary | ICD-10-CM | POA: Insufficient documentation

## 2011-02-24 DIAGNOSIS — J329 Chronic sinusitis, unspecified: Secondary | ICD-10-CM | POA: Insufficient documentation

## 2011-02-24 DIAGNOSIS — I10 Essential (primary) hypertension: Secondary | ICD-10-CM | POA: Insufficient documentation

## 2011-02-24 DIAGNOSIS — E039 Hypothyroidism, unspecified: Secondary | ICD-10-CM | POA: Insufficient documentation

## 2011-03-03 ENCOUNTER — Encounter: Payer: Self-pay | Admitting: Gynecology

## 2011-03-03 ENCOUNTER — Ambulatory Visit (INDEPENDENT_AMBULATORY_CARE_PROVIDER_SITE_OTHER): Payer: Medicare Other | Admitting: Gynecology

## 2011-03-03 DIAGNOSIS — N952 Postmenopausal atrophic vaginitis: Secondary | ICD-10-CM

## 2011-03-03 DIAGNOSIS — E559 Vitamin D deficiency, unspecified: Secondary | ICD-10-CM

## 2011-03-03 DIAGNOSIS — M810 Age-related osteoporosis without current pathological fracture: Secondary | ICD-10-CM

## 2011-03-03 DIAGNOSIS — R82998 Other abnormal findings in urine: Secondary | ICD-10-CM

## 2011-03-03 NOTE — Patient Instructions (Addendum)
Follow on an annual basis. Take a multivitamin with extra vitamin D and calcium and repeat bone density next year.

## 2011-03-03 NOTE — Progress Notes (Signed)
Julie Fritz Jul 17, 1930 [id number]        77 y.o.  for follow up. History of osteoporosis. Status post TAH with several issues as noted below.  Past medical history,surgical history, medications, allergies, family history and social history were all reviewed and documented in the EPIC chart. ROS:  Was performed and pertinent positives and negatives are included in the history.  Exam: chaperone present Filed Vitals:   03/03/11 1034  BP: 122/76   General appearance  Normal Skin grossly normal Head/Neck normal with no cervical or supraclavicular adenopathy thyroid normal Lungs  clear Cardiac RR, without RMG Abdominal  soft, nontender, without masses, organomegaly or hernia Breasts  examined lying and sitting without masses, retractions, discharge or axillary adenopathy. Pelvic  Ext/BUS/vagina  normal with atrophic genital changes  Adnexa  Without masses or tenderness    Anus and perineum  normal   Rectovaginal  normal sphincter tone without palpated masses or tenderness.    Assessment/Plan:  77 y.o. female for annual exam.    1. Osteoporosis. Patient's DEXA November 2011 showed osteoporosis with T score -3.3. This is stable from prior studies and she is off of her Fosamax since December 2010 and doing a drug free holiday. Patient will repeat her bone density next year at a 2 year interval and we'll reassess the need for medication. She has been known to be vitamin D deficient and admits to not taking extra vitamins and I checked a vitamin D level today. Encourage extra calcium vitamin D with discussed dosages. 2. Breast health. Mammogram September 2012. SBE monthly reviewed. Continue with annual mammography. 3. Pap smear. Patient has had consecutive normal Pap smears, the last one in 2011. I reviewed current screening guidelines and we will stop doing Pap smears as she is over 65 and status post hysterectomy without history of abnormal Pap smears before. Patient's comfortable with this  recommendation. 4. Colonoscopy. Patient reports last colonoscopy was 2009 she'll follow up with them as far as recommended repeat interval. Health maintenance scratch that 5. Health maintenance. No blood work was done today as it's all done through her primary physician's office who she actively seize.    Dara Lords MD, 11:50 AM 03/03/2011

## 2011-03-04 LAB — VITAMIN D 25 HYDROXY (VIT D DEFICIENCY, FRACTURES): Vit D, 25-Hydroxy: 31 ng/mL (ref 30–89)

## 2011-03-04 MED ORDER — SULFAMETHOXAZOLE-TMP DS 800-160 MG PO TABS: 1.0000 | ORAL_TABLET | Freq: Two times a day (BID) | ORAL | Status: AC

## 2011-03-04 NOTE — Progress Notes (Signed)
Addended by: Dara Lords on: 03/04/2011 02:17 PM   Modules accepted: Orders

## 2011-04-02 ENCOUNTER — Ambulatory Visit (INDEPENDENT_AMBULATORY_CARE_PROVIDER_SITE_OTHER): Payer: Medicare Other | Admitting: Gynecology

## 2011-04-02 ENCOUNTER — Other Ambulatory Visit: Payer: Self-pay | Admitting: Gynecology

## 2011-04-02 ENCOUNTER — Encounter: Payer: Self-pay | Admitting: Gynecology

## 2011-04-02 DIAGNOSIS — N390 Urinary tract infection, site not specified: Secondary | ICD-10-CM

## 2011-04-02 DIAGNOSIS — R109 Unspecified abdominal pain: Secondary | ICD-10-CM

## 2011-04-02 DIAGNOSIS — R1030 Lower abdominal pain, unspecified: Secondary | ICD-10-CM

## 2011-04-02 LAB — URINALYSIS, MICROSCOPIC ONLY
Casts: NONE SEEN
Crystals: NONE SEEN

## 2011-04-02 LAB — URINALYSIS, ROUTINE W REFLEX MICROSCOPIC
Ketones, ur: NEGATIVE mg/dL
Nitrite: POSITIVE — AB
Specific Gravity, Urine: 1.015 (ref 1.005–1.030)
Urobilinogen, UA: 0.2 mg/dL (ref 0.0–1.0)
pH: 6.5 (ref 5.0–8.0)

## 2011-04-02 MED ORDER — CIPROFLOXACIN HCL 250 MG PO TABS: 250.0000 mg | ORAL_TABLET | Freq: Two times a day (BID) | ORAL | Status: AC

## 2011-04-02 NOTE — Progress Notes (Signed)
Patient presents complaining of lower abdominal discomfort and pelvic heaviness.  Was recently treated for UTI with Septra DS one by mouth twice a day x3 days. No fevers chills nausea or vomiting. She does note some constipation  Exam with Sherrilyn Rist chaperone present Spine straight no CVA tenderness Abdomen soft active bowel sounds nontender without masses guarding rebound organomegaly  Pelvic external BUS vagina atrophic changes bimanual without masses or tenderness difficult to palpate due to abdominal girth. Rectal exam is normal  UA shows 11-20 WBC many bacteria.  Assessment and plan: UTI. Will treat with Cipro Floxin 250 twice a day x7 days. Check culture. We'll also schedule ultrasound rule out other pelvic pathology given her Jewish ancestry and lower abdominal discomfort rule out ovarian disease. Patient will follow up for this.

## 2011-04-02 NOTE — Patient Instructions (Signed)
Take antibiotics twice daily as prescribed. Follow up for ultrasound.

## 2011-04-05 LAB — URINE CULTURE

## 2011-04-18 ENCOUNTER — Other Ambulatory Visit: Payer: Medicare Other

## 2011-04-18 ENCOUNTER — Ambulatory Visit: Payer: Medicare Other | Admitting: Gynecology

## 2011-04-21 ENCOUNTER — Ambulatory Visit: Payer: Medicare Other | Admitting: Gynecology

## 2011-04-21 ENCOUNTER — Other Ambulatory Visit: Payer: Medicare Other

## 2011-04-22 ENCOUNTER — Encounter: Payer: Self-pay | Admitting: Gynecology

## 2011-04-22 ENCOUNTER — Ambulatory Visit (INDEPENDENT_AMBULATORY_CARE_PROVIDER_SITE_OTHER): Payer: Medicare Other

## 2011-04-22 ENCOUNTER — Ambulatory Visit (INDEPENDENT_AMBULATORY_CARE_PROVIDER_SITE_OTHER): Payer: Medicare Other | Admitting: Gynecology

## 2011-04-22 DIAGNOSIS — N83339 Acquired atrophy of ovary and fallopian tube, unspecified side: Secondary | ICD-10-CM

## 2011-04-22 DIAGNOSIS — N949 Unspecified condition associated with female genital organs and menstrual cycle: Secondary | ICD-10-CM

## 2011-04-22 DIAGNOSIS — R1030 Lower abdominal pain, unspecified: Secondary | ICD-10-CM

## 2011-04-22 DIAGNOSIS — R102 Pelvic and perineal pain: Secondary | ICD-10-CM

## 2011-04-22 DIAGNOSIS — R109 Unspecified abdominal pain: Secondary | ICD-10-CM

## 2011-04-22 NOTE — Progress Notes (Signed)
Patient follows up for ultrasound due to her complaints of lower abdominal discomfort. She was treated for UTI at that visit.   Ultrasound today status post hysterectomy shows right ovary atrophic and left ovary with a thin walled echo-free negative peripheral flow follicle or cyst at 10 x 6 mm. Negative cul-de-sac.   Reviewed all this with the patient. I think her discomfort is probably intestinal or secondary to her recently treated UTI. If her pain would continue would recommend following up with her primary but I do not think that it's GYN related. I did discuss with her and her husband that she is status post TAH that adhesions could cause pelvic discomfort status post surgery but this was a number of years ago and this pain is most recent so I doubt that it's related.

## 2011-04-22 NOTE — Patient Instructions (Signed)
Follow up routinely. Pain would persist I recommend follow up with primary physician for evaluation of other causes such as gastrointestinal.

## 2011-04-23 ENCOUNTER — Ambulatory Visit (INDEPENDENT_AMBULATORY_CARE_PROVIDER_SITE_OTHER): Payer: Medicare Other | Admitting: Internal Medicine

## 2011-04-23 ENCOUNTER — Encounter: Payer: Self-pay | Admitting: Internal Medicine

## 2011-04-23 VITALS — BP 120/80 | HR 87 | Temp 98.2°F | Ht 60.0 in | Wt 141.4 lb

## 2011-04-23 DIAGNOSIS — I10 Essential (primary) hypertension: Secondary | ICD-10-CM

## 2011-04-23 DIAGNOSIS — M722 Plantar fascial fibromatosis: Secondary | ICD-10-CM

## 2011-04-23 DIAGNOSIS — G4700 Insomnia, unspecified: Secondary | ICD-10-CM

## 2011-04-23 DIAGNOSIS — J329 Chronic sinusitis, unspecified: Secondary | ICD-10-CM

## 2011-04-23 DIAGNOSIS — M7989 Other specified soft tissue disorders: Secondary | ICD-10-CM

## 2011-04-23 HISTORY — DX: Insomnia, unspecified: G47.00

## 2011-04-23 MED ORDER — CEPHALEXIN 500 MG PO CAPS: 500.0000 mg | ORAL_CAPSULE | Freq: Four times a day (QID) | ORAL | Status: AC

## 2011-04-23 MED ORDER — ZOLPIDEM TARTRATE 5 MG PO TABS: 5.0000 mg | ORAL_TABLET | Freq: Every evening | ORAL | Status: DC | PRN

## 2011-04-23 NOTE — Assessment & Plan Note (Signed)
stable overall by hx and exam, most recent data reviewed with pt, and pt to continue medical treatment as before  BP Readings from Last 3 Encounters:  04/23/11 120/80  03/03/11 122/76  12/24/10 112/80

## 2011-04-23 NOTE — Assessment & Plan Note (Signed)
Mild to mod, for antibx course,  to f/u any worsening symptoms or concerns 

## 2011-04-23 NOTE — Assessment & Plan Note (Signed)
Mild, ? Anxiety related, for ambien prn, to f/u any worsening symptoms or concerns

## 2011-04-23 NOTE — Progress Notes (Signed)
Subjective:    Patient ID: Julie Fritz, female    DOB: [birth year], 78 y.o.   MRN: [id number]  HPI  Here with 3 days acute onset fever, facial pain, pressure, general weakness and malaise, and greenish d/c, with slight ST, but little to no cough and Pt denies chest pain, increased sob or doe, wheezing, orthopnea, PND, increased LE swelling, palpitations, dizziness or syncope.  Pt denies new neurological symptoms such as new headache, or facial or extremity weakness or numbness   Pt denies polydipsia, polyuria.  Denies worsening depressive symptoms, suicidal ideation, or panic, though has ongoing anxiety, not increased recently, but has worsening frequent awakening at night.  Had recent UTI tx per GYN -  Denies urinary symptoms such as dysuria, frequency, urgency,or hematuria.  Has also some mild right plantar pain, tingling, x 1 wk, better today, worse to take first few steps in the AM, better later in the day.   Past Medical History  Diagnosis Date  . FHx: migraine headaches   . ALLERGIC RHINITIS   . Hx of colonic polyps     hx of adenoma last 7/09  . Hyperlipidemia   . Osteoarthritis   . Peptic ulcer disease   . GERD (gastroesophageal reflux disease)   . Left knee DJD   . Right knee DJD   . Ileocecal valve syndrome   . IBS (irritable bowel syndrome)   . Anxiety   . Depression   . Lumbar spondylosis   . ALLERGIC RHINITIS 12/03/2006  . ANXIETY 05/31/2008  . CARPAL TUNNEL SYNDROME, BILATERAL 06/03/2007  . COLONIC POLYPS, HX OF 12/03/2006  . DEPRESSION 05/31/2008  . GERD 03/16/2007  . HYPERLIPIDEMIA 12/03/2006  . Irritable bowel syndrome 09/15/2007  . MIGRAINE HEADACHE 12/03/2006  . OSTEOARTHRITIS 12/03/2006  . PEPTIC ULCER DISEASE 12/03/2006  . Vitamin D deficiency 06/11/2010  . Cervical radicular pain 07/18/2010  . Hypertension   . HYPERTENSION 12/03/2006  . Hypothyroidism   . HYPOTHYROIDISM 12/03/2006  . Sinusitis   . OSTEOPOROSIS 11/2011t score -3.3  . Insomnia 04/23/2011   Past  Surgical History  Procedure Date  . Thyroidectomy     02/2004  . Tonsillectomy     1937  . Back surgery 1990    LUMBAR LAMINECTOMY  . Right hand surgery trigger surgury   . S/p right hand surgury/trigger surgury     Dr. Myerdierck  . Appendectomy     1979  . Abdominal hysterectomy     TAH  . Breast biopsy     1970    reports that she quit smoking about 7 years ago. Her smoking use included Cigarettes. She has quit using smokeless tobacco. She reports that she does not drink alcohol. Her drug history not on file. family history includes Alzheimer's disease in her mother; Dementia in her mother; Hypertension in her mother; and Leukemia in her sister. Allergies  Allergen Reactions  . Tetracycline    Current Outpatient Prescriptions on File Prior to Visit  Medication Sig Dispense Refill  . atorvastatin (LIPITOR) 10 MG tablet TAKE 1 TABLET EVERY DAY  30 tablet  11  . fluticasone (FLONASE) 50 MCG/ACT nasal spray Place 2 sprays into the nose daily.  16 g  2  . gabapentin (NEURONTIN) 100 MG capsule Take 1 capsule (100 mg total) by mouth 3 (three) times daily.  90 capsule  2  . lansoprazole (PREVACID) 30 MG capsule Take 1 capsule (30 mg total) by mouth daily.  90 capsule  3  . levothyroxine (SYNTHROID)  75 MCG tablet Take 1 tablet (75 mcg total) by mouth daily.  90 tablet  3   Current Facility-Administered Medications on File Prior to Visit  Medication Dose Route Frequency Provider Last Rate Last Dose  . pneumococcal 23 valent vaccine (PNU-IMMUNE) injection 0.5 mL  0.5 mL Intramuscular Once Oliver Barre, MD        Review of Systems Review of Systems  Constitutional: Negative for diaphoresis and unexpected weight change.  HENT: Negative for drooling and tinnitus.   Eyes: Negative for photophobia and visual disturbance.  Respiratory: Negative for choking and stridor.   Gastrointestinal: Negative for vomiting and blood in stool.  Genitourinary: Negative for hematuria and decreased urine  volume.    Objective:   Physical Exam BP 120/80  Pulse 87  Temp(Src) 98.2 F (36.8 C) (Oral)  Ht 5' (1.524 m)  Wt 141 lb 6 oz (64.127 kg)  BMI 27.61 kg/m2  SpO2 97% Physical Exam  VS noted, mild ill Constitutional: Pt appears well-developed and well-nourished.  HENT: Head: Normocephalic.  Right Ear: External ear normal.  Left Ear: External ear normal.  Bilat tm's mild erythema.  Sinus tender right > left.  Pharynx mild erythema Eyes: Conjunctivae and EOM are normal. Pupils are equal, round, and reactive to light.  Neck: Normal range of motion. Neck supple.  Cardiovascular: Normal rate and regular rhythm.   Pulmonary/Chest: Effort normal and breath sounds normal.  Neurological: Pt is alert. No cranial nerve deficit.  Skin: Skin is warm. No erythema.  Right plantar - mild tender heel Psychiatric: Pt behavior is normal. Thought content normal. 1+ nervous    Assessment & Plan:

## 2011-04-23 NOTE — Assessment & Plan Note (Signed)
Mild, d/w pt, for tylenol prn, to f/u any worsening symptoms or concerns, consider podiatry

## 2011-04-23 NOTE — Patient Instructions (Signed)
Take all new medications as prescribed Continue all other medications as before  

## 2011-05-06 ENCOUNTER — Encounter: Payer: Self-pay | Admitting: Gynecology

## 2011-05-06 ENCOUNTER — Ambulatory Visit (INDEPENDENT_AMBULATORY_CARE_PROVIDER_SITE_OTHER): Payer: Medicare Other | Admitting: Gynecology

## 2011-05-06 DIAGNOSIS — R102 Pelvic and perineal pain: Secondary | ICD-10-CM

## 2011-05-06 DIAGNOSIS — N899 Noninflammatory disorder of vagina, unspecified: Secondary | ICD-10-CM

## 2011-05-06 DIAGNOSIS — N898 Other specified noninflammatory disorders of vagina: Secondary | ICD-10-CM

## 2011-05-06 DIAGNOSIS — N9489 Other specified conditions associated with female genital organs and menstrual cycle: Secondary | ICD-10-CM

## 2011-05-06 LAB — URINALYSIS W MICROSCOPIC + REFLEX CULTURE
Glucose, UA: NEGATIVE mg/dL
Leukocytes, UA: NEGATIVE
Protein, ur: NEGATIVE mg/dL
WBC, UA: NEGATIVE WBC/hpf (ref <3)

## 2011-05-06 LAB — WET PREP FOR TRICH, YEAST, CLUE
Trich, Wet Prep: NONE SEEN
Yeast Wet Prep HPF POC: NONE SEEN

## 2011-05-06 MED ORDER — FLUCONAZOLE 150 MG PO TABS: 150.0000 mg | ORAL_TABLET | Freq: Once | ORAL | Status: AC

## 2011-05-06 MED ORDER — METRONIDAZOLE 500 MG PO TABS: 500.0000 mg | ORAL_TABLET | Freq: Two times a day (BID) | ORAL | Status: AC

## 2011-05-06 NOTE — Progress Notes (Signed)
Patient presents complaining of suprapubic pelvic heaviness in vaginal irritation. She is at the tail end of a treatment for sinusitis on Keflex 500 4 times a day. Recently was treated for UTI in January of to ciprofloxacin. No fevers chills or other constitutional symptoms.  Exam with Selena Batten chaperone present. Abdomen soft nontender without masses guarding rebound organomegaly.  Pelvic external BUS vagina with some mild generalized vulvitis. Scant discharge. Bimanual without masses or tenderness.  Assessment and plan: Wet prep shows some bacteria and I'm going to treat her as a bacterial vaginosis with Flagyl 500 twice a day x7 days, alcohol avoidance reviewed. I am also going to cover her with Diflucan 150x1 dose even though the wet prep was negative given her history of recent antibiotic use. If her symptoms persist from a lower abdominal discomfort standpoint I recommended she follow up with GI as she is status post TAH and has a recent pelvic ultrasound which was negative.

## 2011-05-06 NOTE — Patient Instructions (Signed)
Take antibiotics as prescribed follow up if symptoms persist.

## 2011-05-25 ENCOUNTER — Other Ambulatory Visit: Payer: Self-pay | Admitting: Internal Medicine

## 2011-06-25 ENCOUNTER — Encounter: Payer: Self-pay | Admitting: Internal Medicine

## 2011-07-08 ENCOUNTER — Other Ambulatory Visit (INDEPENDENT_AMBULATORY_CARE_PROVIDER_SITE_OTHER): Payer: Medicare Other

## 2011-07-08 DIAGNOSIS — E039 Hypothyroidism, unspecified: Secondary | ICD-10-CM

## 2011-07-08 DIAGNOSIS — Z0000 Encounter for general adult medical examination without abnormal findings: Secondary | ICD-10-CM

## 2011-07-08 LAB — CBC WITH DIFFERENTIAL/PLATELET
Basophils Relative: 0.6 % (ref 0.0–3.0)
Eosinophils Relative: 2.6 % (ref 0.0–5.0)
Lymphocytes Relative: 33 % (ref 12.0–46.0)
Monocytes Relative: 7.8 % (ref 3.0–12.0)
Neutrophils Relative %: 56 % (ref 43.0–77.0)
RBC: 4.76 Mil/uL (ref 3.87–5.11)
WBC: 5.5 10*3/uL (ref 4.5–10.5)

## 2011-07-08 LAB — BASIC METABOLIC PANEL
BUN: 24 mg/dL — ABNORMAL HIGH (ref 6–23)
CO2: 26 mEq/L (ref 19–32)
Chloride: 110 mEq/L (ref 96–112)
Creatinine, Ser: 1 mg/dL (ref 0.4–1.2)
Glucose, Bld: 96 mg/dL (ref 70–99)

## 2011-07-08 LAB — URINALYSIS, ROUTINE W REFLEX MICROSCOPIC
Bilirubin Urine: NEGATIVE
Urine Glucose: NEGATIVE
Urobilinogen, UA: 0.2 (ref 0.0–1.0)

## 2011-07-08 LAB — HEPATIC FUNCTION PANEL
ALT: 16 U/L (ref 0–35)
Albumin: 3.8 g/dL (ref 3.5–5.2)
Total Bilirubin: 0.4 mg/dL (ref 0.3–1.2)
Total Protein: 7.2 g/dL (ref 6.0–8.3)

## 2011-07-08 LAB — LIPID PANEL
Cholesterol: 167 mg/dL (ref 0–200)
LDL Cholesterol: 89 mg/dL (ref 0–99)
Triglycerides: 156 mg/dL — ABNORMAL HIGH (ref 0.0–149.0)

## 2011-07-09 ENCOUNTER — Ambulatory Visit (INDEPENDENT_AMBULATORY_CARE_PROVIDER_SITE_OTHER): Payer: Medicare Other | Admitting: Internal Medicine

## 2011-07-09 ENCOUNTER — Encounter: Payer: Self-pay | Admitting: Internal Medicine

## 2011-07-09 VITALS — BP 110/70 | HR 69 | Temp 97.3°F | Ht 59.0 in | Wt 141.1 lb

## 2011-07-09 DIAGNOSIS — Z0000 Encounter for general adult medical examination without abnormal findings: Secondary | ICD-10-CM

## 2011-07-09 DIAGNOSIS — E039 Hypothyroidism, unspecified: Secondary | ICD-10-CM

## 2011-07-09 DIAGNOSIS — M79609 Pain in unspecified limb: Secondary | ICD-10-CM

## 2011-07-09 DIAGNOSIS — I10 Essential (primary) hypertension: Secondary | ICD-10-CM

## 2011-07-09 DIAGNOSIS — E785 Hyperlipidemia, unspecified: Secondary | ICD-10-CM

## 2011-07-09 DIAGNOSIS — M79673 Pain in unspecified foot: Secondary | ICD-10-CM

## 2011-07-09 MED ORDER — LISINOPRIL 5 MG PO TABS: 5.0000 mg | ORAL_TABLET | Freq: Every day | ORAL | Status: DC

## 2011-07-09 NOTE — Patient Instructions (Addendum)
Continue all other medications as before Please continue to work on being active as possible, lower cholesterol diet, and weight control Please return in 6 mo with Lab testing done 3-5 days before

## 2011-07-09 NOTE — Assessment & Plan Note (Signed)

## 2011-07-09 NOTE — Progress Notes (Signed)
Subjective:    Patient ID: Julie Fritz, female    DOB: 31-Jul-1930, 78 y.o.   MRN: [id number]  HPI  Here for wellness and f/u;  Overall doing ok;  Pt denies CP, worsening SOB, DOE, wheezing, orthopnea, PND, worsening LE edema, palpitations, dizziness or syncope.  Pt denies neurological change such as new Headache, facial or extremity weakness.  Pt denies polydipsia, polyuria, or low sugar symptoms. Pt states overall good compliance with treatment and medications, good tolerability, and trying to follow lower cholesterol diet.  Pt denies worsening depressive symptoms, suicidal ideation or panic. No fever, wt loss, night sweats, loss of appetite, or other constitutional symptoms.  Pt states good ability with ADL's, low fall risk, home safety reviewed and adequate, no significant changes in hearing or vision, and occasionally active with exercise.  Does still have some right heel pain but better today.  Wt up to 140, wants to increase her matabilism to help lose wt.   Past Medical History  Diagnosis Date  . FHx: migraine headaches   . ALLERGIC RHINITIS   . Hx of colonic polyps     hx of adenoma last 7/09  . Hyperlipidemia   . Osteoarthritis   . Peptic ulcer disease   . GERD (gastroesophageal reflux disease)   . Left knee DJD   . Right knee DJD   . Ileocecal valve syndrome   . IBS (irritable bowel syndrome)   . Anxiety   . Depression   . Lumbar spondylosis   . ALLERGIC RHINITIS 12/03/2006  . ANXIETY 05/31/2008  . CARPAL TUNNEL SYNDROME, BILATERAL 06/03/2007  . COLONIC POLYPS, HX OF 12/03/2006  . DEPRESSION 05/31/2008  . GERD 03/16/2007  . HYPERLIPIDEMIA 12/03/2006  . Irritable bowel syndrome 09/15/2007  . MIGRAINE HEADACHE 12/03/2006  . OSTEOARTHRITIS 12/03/2006  . PEPTIC ULCER DISEASE 12/03/2006  . Vitamin d deficiency 06/11/2010  . Cervical radicular pain 07/18/2010  . Hypertension   . HYPERTENSION 12/03/2006  . Hypothyroidism   . HYPOTHYROIDISM 12/03/2006  . Sinusitis   . OSTEOPOROSIS  11/2011t score -3.3  . Insomnia 04/23/2011   Past Surgical History  Procedure Date  . Thyroidectomy     02/2004  . Tonsillectomy     1937  . Back surgery 1990    LUMBAR LAMINECTOMY  . Right hand surgery trigger surgury   . S/p right hand surgury/trigger surgury     Dr. Myerdierck  . Appendectomy     1979  . Abdominal hysterectomy     TAH  . Breast biopsy     1970    reports that she quit smoking about 7 years ago. Her smoking use included Cigarettes. She has quit using smokeless tobacco. She reports that she does not drink alcohol. Her drug history not on file. family history includes Alzheimer's disease in her mother; Dementia in her mother; Hypertension in her mother; and Leukemia in her sister. Allergies  Allergen Reactions  . Tetracycline    Current Outpatient Prescriptions on File Prior to Visit  Medication Sig Dispense Refill  . atorvastatin (LIPITOR) 10 MG tablet TAKE 1 TABLET EVERY DAY  30 tablet  11  . fluticasone (FLONASE) 50 MCG/ACT nasal spray Place 2 sprays into the nose daily.  16 g  2  . gabapentin (NEURONTIN) 100 MG capsule Take 1 capsule (100 mg total) by mouth 3 (three) times daily.  90 capsule  2  . lansoprazole (PREVACID) 30 MG capsule TAKE ONE CAPSULE BY MOUTH EVERY DAY  90 capsule  2  .  levothyroxine (SYNTHROID) 75 MCG tablet Take 1 tablet (75 mcg total) by mouth daily.  90 tablet  3  . zolpidem (AMBIEN) 5 MG tablet Take 1 tablet (5 mg total) by mouth at bedtime as needed for sleep.  30 tablet  5  . cephALEXin (KEFLEX) 500 MG capsule Take 500 mg by mouth 4 (four) times daily.       Current Facility-Administered Medications on File Prior to Visit  Medication Dose Route Frequency Provider Last Rate Last Dose  . pneumococcal 23 valent vaccine (PNU-IMMUNE) injection 0.5 mL  0.5 mL Intramuscular Once Corwin Levins, MD       Review of Systems Review of Systems  Constitutional: Negative for diaphoresis, activity change, appetite change and unexpected weight  change.  HENT: Negative for hearing loss, ear pain, facial swelling, mouth sores and neck stiffness.   Eyes: Negative for pain, redness and visual disturbance.  Respiratory: Negative for shortness of breath and wheezing.   Cardiovascular: Negative for chest pain and palpitations.  Gastrointestinal: Negative for diarrhea, blood in stool, abdominal distention and rectal pain.  Genitourinary: Negative for hematuria, flank pain and decreased urine volume.  Musculoskeletal: Negative for myalgias and joint swelling.  Skin: Negative for color change and wound.  Neurological: Negative for syncope and numbness.  Hematological: Negative for adenopathy.  Psychiatric/Behavioral: Negative for hallucinations, self-injury, decreased concentration and agitation.      Objective:   Physical Exam BP 110/70  Pulse 69  Temp(Src) 97.3 F (36.3 C) (Oral)  Ht 4\' 11"  (1.499 m)  Wt 141 lb 2 oz (64.014 kg)  BMI 28.50 kg/m2  SpO2 97% Physical Exam  VS noted Constitutional: Pt is oriented to person, place, and time. Appears well-developed and well-nourished.  HENT:  Head: Normocephalic and atraumatic.  Right Ear: External ear normal.  Left Ear: External ear normal.  Nose: Nose normal.  Mouth/Throat: Oropharynx is clear and moist.  Eyes: Conjunctivae and EOM are normal. Pupils are equal, round, and reactive to light.  Neck: Normal range of motion. Neck supple. No JVD present. No tracheal deviation present.  Cardiovascular: Normal rate, regular rhythm, normal heart sounds and intact distal pulses.   Pulmonary/Chest: Effort normal and breath sounds normal.  Abdominal: Soft. Bowel sounds are normal. There is no tenderness.  Musculoskeletal: Normal range of motion. Exhibits no edema.  Lymphadenopathy:  Has no cervical adenopathy.  Neurological: Pt is alert and oriented to person, place, and time. Pt has normal reflexes. No cranial nerve deficit.  Skin: Skin is warm and dry. No rash noted.  Psychiatric:    Behavior is normal. 1+ nervous    Assessment & Plan:

## 2011-07-13 ENCOUNTER — Encounter: Payer: Self-pay | Admitting: Internal Medicine

## 2011-07-13 DIAGNOSIS — M79673 Pain in unspecified foot: Secondary | ICD-10-CM | POA: Insufficient documentation

## 2011-07-13 NOTE — Assessment & Plan Note (Signed)
stable overall by hx and exam, most recent data reviewed with pt, and pt to continue medical treatment as before  BP Readings from Last 3 Encounters:  07/09/11 110/70  04/23/11 120/80  03/03/11 122/76

## 2011-07-13 NOTE — Assessment & Plan Note (Signed)
stable overall by hx and exam, most recent data reviewed with pt, and pt to continue medical treatment as before  Lab Results  Component Value Date   LDLCALC 89 07/08/2011

## 2011-07-13 NOTE — Assessment & Plan Note (Signed)
stable overall by hx and exam, most recent data reviewed with pt, and pt to continue medical treatment as before  Lab Results  Component Value Date   TSH 1.00 07/08/2011

## 2011-07-13 NOTE — Assessment & Plan Note (Signed)
C/w mild plantar fasciitis, for tylenol prn, shoe inserts, consider podiatry referral

## 2011-07-29 ENCOUNTER — Other Ambulatory Visit: Payer: Self-pay | Admitting: Internal Medicine

## 2011-08-02 ENCOUNTER — Emergency Department (HOSPITAL_COMMUNITY): Payer: Medicare Other

## 2011-08-02 ENCOUNTER — Encounter (HOSPITAL_COMMUNITY): Payer: Self-pay

## 2011-08-02 ENCOUNTER — Observation Stay (HOSPITAL_COMMUNITY)
Admission: EM | Admit: 2011-08-02 | Discharge: 2011-08-05 | Disposition: A | Discharge status: Skilled Nursing Facility | Payer: Medicare Other | Attending: Internal Medicine | Admitting: Internal Medicine

## 2011-08-02 DIAGNOSIS — M79673 Pain in unspecified foot: Secondary | ICD-10-CM

## 2011-08-02 DIAGNOSIS — Y92009 Unspecified place in unspecified non-institutional (private) residence as the place of occurrence of the external cause: Secondary | ICD-10-CM | POA: Insufficient documentation

## 2011-08-02 DIAGNOSIS — I10 Essential (primary) hypertension: Secondary | ICD-10-CM | POA: Diagnosis present

## 2011-08-02 DIAGNOSIS — S32509A Unspecified fracture of unspecified pubis, initial encounter for closed fracture: Principal | ICD-10-CM | POA: Insufficient documentation

## 2011-08-02 DIAGNOSIS — G43909 Migraine, unspecified, not intractable, without status migrainosus: Secondary | ICD-10-CM

## 2011-08-02 DIAGNOSIS — D376 Neoplasm of uncertain behavior of liver, gallbladder and bile ducts: Secondary | ICD-10-CM

## 2011-08-02 DIAGNOSIS — N179 Acute kidney failure, unspecified: Secondary | ICD-10-CM | POA: Insufficient documentation

## 2011-08-02 DIAGNOSIS — F329 Major depressive disorder, single episode, unspecified: Secondary | ICD-10-CM

## 2011-08-02 DIAGNOSIS — M47812 Spondylosis without myelopathy or radiculopathy, cervical region: Secondary | ICD-10-CM

## 2011-08-02 DIAGNOSIS — M722 Plantar fascial fibromatosis: Secondary | ICD-10-CM

## 2011-08-02 DIAGNOSIS — E039 Hypothyroidism, unspecified: Secondary | ICD-10-CM | POA: Insufficient documentation

## 2011-08-02 DIAGNOSIS — M810 Age-related osteoporosis without current pathological fracture: Secondary | ICD-10-CM

## 2011-08-02 DIAGNOSIS — Z79899 Other long term (current) drug therapy: Secondary | ICD-10-CM | POA: Insufficient documentation

## 2011-08-02 DIAGNOSIS — S329XXA Fracture of unspecified parts of lumbosacral spine and pelvis, initial encounter for closed fracture: Secondary | ICD-10-CM

## 2011-08-02 DIAGNOSIS — G4700 Insomnia, unspecified: Secondary | ICD-10-CM

## 2011-08-02 DIAGNOSIS — G5600 Carpal tunnel syndrome, unspecified upper limb: Secondary | ICD-10-CM

## 2011-08-02 DIAGNOSIS — S32592A Other specified fracture of left pubis, initial encounter for closed fracture: Secondary | ICD-10-CM

## 2011-08-02 DIAGNOSIS — S7002XA Contusion of left hip, initial encounter: Secondary | ICD-10-CM

## 2011-08-02 DIAGNOSIS — Z86010 Personal history of colonic polyps: Secondary | ICD-10-CM

## 2011-08-02 DIAGNOSIS — Z0000 Encounter for general adult medical examination without abnormal findings: Secondary | ICD-10-CM

## 2011-08-02 DIAGNOSIS — J309 Allergic rhinitis, unspecified: Secondary | ICD-10-CM

## 2011-08-02 DIAGNOSIS — N189 Chronic kidney disease, unspecified: Secondary | ICD-10-CM | POA: Insufficient documentation

## 2011-08-02 DIAGNOSIS — R7309 Other abnormal glucose: Secondary | ICD-10-CM | POA: Insufficient documentation

## 2011-08-02 DIAGNOSIS — W19XXXA Unspecified fall, initial encounter: Secondary | ICD-10-CM

## 2011-08-02 DIAGNOSIS — K219 Gastro-esophageal reflux disease without esophagitis: Secondary | ICD-10-CM

## 2011-08-02 DIAGNOSIS — M47817 Spondylosis without myelopathy or radiculopathy, lumbosacral region: Secondary | ICD-10-CM

## 2011-08-02 DIAGNOSIS — K589 Irritable bowel syndrome without diarrhea: Secondary | ICD-10-CM

## 2011-08-02 DIAGNOSIS — R296 Repeated falls: Secondary | ICD-10-CM | POA: Insufficient documentation

## 2011-08-02 DIAGNOSIS — M5412 Radiculopathy, cervical region: Secondary | ICD-10-CM

## 2011-08-02 DIAGNOSIS — D72829 Elevated white blood cell count, unspecified: Secondary | ICD-10-CM | POA: Insufficient documentation

## 2011-08-02 DIAGNOSIS — I129 Hypertensive chronic kidney disease with stage 1 through stage 4 chronic kidney disease, or unspecified chronic kidney disease: Secondary | ICD-10-CM | POA: Insufficient documentation

## 2011-08-02 DIAGNOSIS — R109 Unspecified abdominal pain: Secondary | ICD-10-CM | POA: Insufficient documentation

## 2011-08-02 DIAGNOSIS — K279 Peptic ulcer, site unspecified, unspecified as acute or chronic, without hemorrhage or perforation: Secondary | ICD-10-CM

## 2011-08-02 DIAGNOSIS — K625 Hemorrhage of anus and rectum: Secondary | ICD-10-CM

## 2011-08-02 DIAGNOSIS — E785 Hyperlipidemia, unspecified: Secondary | ICD-10-CM | POA: Diagnosis present

## 2011-08-02 DIAGNOSIS — F411 Generalized anxiety disorder: Secondary | ICD-10-CM

## 2011-08-02 DIAGNOSIS — M1990 Unspecified osteoarthritis, unspecified site: Secondary | ICD-10-CM

## 2011-08-02 DIAGNOSIS — M549 Dorsalgia, unspecified: Secondary | ICD-10-CM

## 2011-08-02 LAB — COMPREHENSIVE METABOLIC PANEL
ALT: 21 U/L (ref 0–35)
Alkaline Phosphatase: 99 U/L (ref 39–117)
BUN: 27 mg/dL — ABNORMAL HIGH (ref 6–23)
CO2: 21 mEq/L (ref 19–32)
GFR calc Af Amer: 53 mL/min — ABNORMAL LOW (ref >90)
GFR calc non Af Amer: 46 mL/min — ABNORMAL LOW (ref >90)
Glucose, Bld: 118 mg/dL — ABNORMAL HIGH (ref 70–99)
Potassium: 3.8 mEq/L (ref 3.5–5.1)
Sodium: 140 mEq/L (ref 135–145)
Total Bilirubin: 0.2 mg/dL — ABNORMAL LOW (ref 0.3–1.2)

## 2011-08-02 LAB — CBC
Hemoglobin: 13.3 g/dL (ref 12.0–15.0)
MCV: 85 fL (ref 78.0–100.0)
Platelets: 183 10*3/uL (ref 150–400)
RBC: 4.79 MIL/uL (ref 3.87–5.11)
WBC: 11.6 10*3/uL — ABNORMAL HIGH (ref 4.0–10.5)

## 2011-08-02 LAB — DIFFERENTIAL
Basophils Relative: 0 % (ref 0–1)
Eosinophils Relative: 1 % (ref 0–5)
Monocytes Relative: 6 % (ref 3–12)
Neutrophils Relative %: 77 % (ref 43–77)

## 2011-08-02 MED ORDER — ONDANSETRON HCL 4 MG/2ML IJ SOLN
4.0000 mg | Freq: Once | INTRAMUSCULAR | Status: AC
  Administered 2011-08-02: 4 mg via INTRAVENOUS
  Filled 2011-08-02: qty 2

## 2011-08-02 MED ORDER — MORPHINE SULFATE 4 MG/ML IJ SOLN
4.0000 mg | Freq: Once | INTRAMUSCULAR | Status: AC
  Administered 2011-08-02: 4 mg via INTRAVENOUS
  Filled 2011-08-02: qty 1

## 2011-08-02 NOTE — Consult Note (Signed)
Reason for Consult:pelvic fx Referring Physician: Dr. Larena Glassman is an 78 y.o. female.  HPI: very pleasant 78 yo caucasion female mechanical fall tonight c/o left groin hip pain . Unable to weight bear transported [facility] and sdx with left pelvic fx I was consulted .no  Other c/o  Past Medical History  Diagnosis Date  . FHx: migraine headaches   . ALLERGIC RHINITIS   . Hx of colonic polyps     hx of adenoma last 7/09  . Hyperlipidemia   . Osteoarthritis   . Peptic ulcer disease   . GERD (gastroesophageal reflux disease)   . Left knee DJD   . Right knee DJD   . Ileocecal valve syndrome   . IBS (irritable bowel syndrome)   . Anxiety   . Depression   . Lumbar spondylosis   . ALLERGIC RHINITIS 12/03/2006  . ANXIETY 05/31/2008  . CARPAL TUNNEL SYNDROME, BILATERAL 06/03/2007  . COLONIC POLYPS, HX OF 12/03/2006  . DEPRESSION 05/31/2008  . GERD 03/16/2007  . HYPERLIPIDEMIA 12/03/2006  . Irritable bowel syndrome 09/15/2007  . MIGRAINE HEADACHE 12/03/2006  . OSTEOARTHRITIS 12/03/2006  . PEPTIC ULCER DISEASE 12/03/2006  . Vitamin d deficiency 06/11/2010  . Cervical radicular pain 07/18/2010  . Hypertension   . HYPERTENSION 12/03/2006  . Hypothyroidism   . HYPOTHYROIDISM 12/03/2006  . Sinusitis   . OSTEOPOROSIS 11/2011t score -3.3  . Insomnia 04/23/2011    Past Surgical History  Procedure Date  . Thyroidectomy     02/2004  . Tonsillectomy     1937  . Back surgery 1990    LUMBAR LAMINECTOMY  . Right hand surgery trigger surgury   . S/p right hand surgury/trigger surgury     Dr. Myerdierck  . Appendectomy     1979  . Abdominal hysterectomy     TAH  . Breast biopsy     1970    Family History  Problem Relation Age of Onset  . Dementia Mother   . Hypertension Mother   . Alzheimer's disease Mother   . Leukemia Sister     Social History:  reports that she quit smoking about 7 years ago. Her smoking use included Cigarettes. She has quit using smokeless tobacco.  She reports that she does not drink alcohol. Her drug history not on file.  Allergies:  Allergies  Allergen Reactions  . Tetracycline     "Lightheaded."    Medications: I have reviewed the patient's current medications.  Results for orders placed during the hospital encounter of 08/02/11 (from the past 48 hour(s))  CBC     Status: Abnormal   Collection Time   08/02/11 10:30 PM      Component Value Range Comment   WBC 11.6 (*) 4.0 - 10.5 (K/uL)    RBC 4.79  3.87 - 5.11 (MIL/uL)    Hemoglobin 13.3  12.0 - 15.0 (g/dL)    HCT 40.1  02.7 - 25.3 (%)    MCV 85.0  78.0 - 100.0 (fL)    MCH 27.8  26.0 - 34.0 (pg)    MCHC 32.7  30.0 - 36.0 (g/dL)    RDW 66.4  40.3 - 47.4 (%)    Platelets 183  150 - 400 (K/uL)   DIFFERENTIAL     Status: Normal (Preliminary result)   Collection Time   08/02/11 10:30 PM      Component Value Range Comment   Neutrophils Relative PENDING  43 - 77 (%)    Neutro  Abs PENDING  1.7 - 7.7 (K/uL)    Band Neutrophils PENDING  0 - 10 (%)    Lymphocytes Relative PENDING  12 - 46 (%)    Lymphs Abs PENDING  0.7 - 4.0 (K/uL)    Monocytes Relative PENDING  3 - 12 (%)    Monocytes Absolute PENDING  0.1 - 1.0 (K/uL)    Eosinophils Relative PENDING  0 - 5 (%)    Eosinophils Absolute PENDING  0.0 - 0.7 (K/uL)    Basophils Relative PENDING  0 - 1 (%)    Basophils Absolute PENDING  0.0 - 0.1 (K/uL)    WBC Morphology PENDING      RBC Morphology PENDING      Smear Review PENDING      nRBC PENDING  0 (/100 WBC)    Metamyelocytes Relative PENDING      Myelocytes PENDING      Promyelocytes Absolute PENDING      Blasts PENDING     COMPREHENSIVE METABOLIC PANEL     Status: Abnormal   Collection Time   08/02/11 10:30 PM      Component Value Range Comment   Sodium 140  135 - 145 (mEq/L)    Potassium 3.8  3.5 - 5.1 (mEq/L)    Chloride 106  96 - 112 (mEq/L)    CO2 21  19 - 32 (mEq/L)    Glucose, Bld 118 (*) 70 - 99 (mg/dL)    BUN 27 (*) 6 - 23 (mg/dL)    Creatinine, Ser 4.09  (*) 0.50 - 1.10 (mg/dL)    Calcium 9.3  8.4 - 10.5 (mg/dL)    Total Protein 7.1  6.0 - 8.3 (g/dL)    Albumin 3.7  3.5 - 5.2 (g/dL)    AST 29  0 - 37 (U/L)    ALT 21  0 - 35 (U/L)    Alkaline Phosphatase 99  39 - 117 (U/L)    Total Bilirubin 0.2 (*) 0.3 - 1.2 (mg/dL)    GFR calc non Af Amer 46 (*) >90 (mL/min)    GFR calc Af Amer 53 (*) >90 (mL/min)     Dg Hip Complete Left  08/02/2011  *RADIOLOGY REPORT*  Clinical Data: Left hip and pelvic pain after fall.  LEFT HIP - COMPLETE 2+ VIEW  Comparison: None.  Findings: Acute appearing nondisplaced fracture of the superior and inferior left pubic rami.  The left hip appears intact without evidence of fracture or subluxation.  No focal bone lesion or bone destruction.  No abnormal periosteal reaction.  SI joints and symphysis pubis appear intact.  IMPRESSION: Nondisplaced acute fractures of the left superior and inferior pubic rami.  Original Report Authenticated By: Marlon Pel, M.D.    ROS Blood pressure 143/71, pulse 86, temperature 98.2 F (36.8 C), temperature source Oral, resp. rate 18, SpO2 97.00%. Physical Exam Awake and alert  Left LE normal lenghth no pain passive hip rotation but unable to lift LE   Distal NV grossly intact Assessment/Plan: Stable left pubic rami fxs   May weight bear as tolerated. Physical Therapy  Walker and analgesics. Also recommend TED hose   PAS comp stockings while in bed and prophylactic dose lovenox   Will follow while in hospital after D/C home needs F/U in office in 3 weeks  Julie Fritz 08/02/2011, 11:32 PM

## 2011-08-02 NOTE — ED Notes (Signed)
WRU:[id number]<UJ> Expected date:<BR> Expected time:<BR> Means of arrival:<BR> Comments:<BR> EMS 10 GC, 81 yof fall w pain to buttocks and groin area

## 2011-08-02 NOTE — ED Notes (Signed)
Per EMS, pt from home.  Pt fell down two steps to sunken living room.  Pt landed on wood floor.  Pt c/o left buttock pain.  Pain with standing.  No external rotation of leg.  No LOC.  No head injury.  VS 158/96, hr 96, resp 18

## 2011-08-03 DIAGNOSIS — R1032 Left lower quadrant pain: Secondary | ICD-10-CM

## 2011-08-03 DIAGNOSIS — K560 Paralytic ileus: Secondary | ICD-10-CM

## 2011-08-03 DIAGNOSIS — S72009A Fracture of unspecified part of neck of unspecified femur, initial encounter for closed fracture: Secondary | ICD-10-CM

## 2011-08-03 DIAGNOSIS — E119 Type 2 diabetes mellitus without complications: Secondary | ICD-10-CM

## 2011-08-03 LAB — BASIC METABOLIC PANEL
BUN: 25 mg/dL — ABNORMAL HIGH (ref 6–23)
Calcium: 8.8 mg/dL (ref 8.4–10.5)
Chloride: 106 mEq/L (ref 96–112)
Creatinine, Ser: 1 mg/dL (ref 0.50–1.10)
GFR calc Af Amer: 61 mL/min — ABNORMAL LOW (ref >90)
GFR calc non Af Amer: 53 mL/min — ABNORMAL LOW (ref >90)

## 2011-08-03 LAB — HEMOGLOBIN A1C: Mean Plasma Glucose: 137 mg/dL — ABNORMAL HIGH (ref <117)

## 2011-08-03 LAB — CBC
MCHC: 33.3 g/dL (ref 30.0–36.0)
Platelets: 166 10*3/uL (ref 150–400)
RDW: 13.4 % (ref 11.5–15.5)
WBC: 6.7 10*3/uL (ref 4.0–10.5)

## 2011-08-03 LAB — MAGNESIUM: Magnesium: 1.8 mg/dL (ref 1.5–2.5)

## 2011-08-03 MED ORDER — ATORVASTATIN CALCIUM 20 MG PO TABS
20.0000 mg | ORAL_TABLET | Freq: Every day | ORAL | Status: DC
  Administered 2011-08-03 – 2011-08-04 (×2): 20 mg via ORAL
  Filled 2011-08-03 (×3): qty 1

## 2011-08-03 MED ORDER — ENOXAPARIN SODIUM 40 MG/0.4ML ~~LOC~~ SOLN
40.0000 mg | SUBCUTANEOUS | Status: DC
  Administered 2011-08-03 – 2011-08-05 (×3): 40 mg via SUBCUTANEOUS
  Filled 2011-08-03 (×3): qty 0.4

## 2011-08-03 MED ORDER — ZOLPIDEM TARTRATE 5 MG PO TABS: 5.0000 mg | ORAL_TABLET | Freq: Every evening | ORAL | Status: DC | PRN

## 2011-08-03 MED ORDER — MORPHINE SULFATE 2 MG/ML IJ SOLN: 1.0000 mg | INTRAMUSCULAR | Status: DC | PRN

## 2011-08-03 MED ORDER — ONDANSETRON HCL 4 MG PO TABS: 4.0000 mg | ORAL_TABLET | Freq: Four times a day (QID) | ORAL | Status: DC | PRN

## 2011-08-03 MED ORDER — MORPHINE SULFATE 4 MG/ML IJ SOLN: 4.0000 mg | INTRAMUSCULAR | Status: DC | PRN

## 2011-08-03 MED ORDER — ENOXAPARIN SODIUM 30 MG/0.3ML ~~LOC~~ SOLN
30.0000 mg | SUBCUTANEOUS | Status: DC
  Filled 2011-08-03: qty 0.3

## 2011-08-03 MED ORDER — LISINOPRIL 5 MG PO TABS
5.0000 mg | ORAL_TABLET | Freq: Every day | ORAL | Status: DC
  Administered 2011-08-03 – 2011-08-05 (×3): 5 mg via ORAL
  Filled 2011-08-03 (×3): qty 1

## 2011-08-03 MED ORDER — SODIUM CHLORIDE 0.9 % IV SOLN
INTRAVENOUS | Status: AC
  Administered 2011-08-03 (×2): via INTRAVENOUS

## 2011-08-03 MED ORDER — POLYETHYLENE GLYCOL 3350 17 G PO PACK
17.0000 g | PACK | Freq: Every day | ORAL | Status: DC | PRN
  Filled 2011-08-03: qty 1

## 2011-08-03 MED ORDER — IOHEXOL 300 MG/ML  SOLN
100.0000 mL | Freq: Once | INTRAMUSCULAR | Status: AC | PRN
  Administered 2011-08-03: 100 mL via INTRAVENOUS

## 2011-08-03 MED ORDER — ONDANSETRON HCL 4 MG/2ML IJ SOLN: 4.0000 mg | Freq: Four times a day (QID) | INTRAMUSCULAR | Status: DC | PRN

## 2011-08-03 MED ORDER — HYDROCODONE-ACETAMINOPHEN 5-325 MG PO TABS
1.0000 | ORAL_TABLET | ORAL | Status: DC | PRN
  Administered 2011-08-03: 2 via ORAL
  Administered 2011-08-03 (×2): 1 via ORAL
  Administered 2011-08-04 – 2011-08-05 (×4): 2 via ORAL
  Filled 2011-08-03 (×2): qty 2
  Filled 2011-08-03: qty 1
  Filled 2011-08-03 (×4): qty 2

## 2011-08-03 MED ORDER — PANTOPRAZOLE SODIUM 20 MG PO TBEC
20.0000 mg | DELAYED_RELEASE_TABLET | Freq: Every day | ORAL | Status: DC
  Administered 2011-08-03 – 2011-08-05 (×3): 20 mg via ORAL
  Filled 2011-08-03 (×3): qty 1

## 2011-08-03 MED ORDER — FLUTICASONE PROPIONATE 50 MCG/ACT NA SUSP
2.0000 | Freq: Every day | NASAL | Status: DC
  Administered 2011-08-03 – 2011-08-05 (×2): 2 via NASAL
  Filled 2011-08-03: qty 16

## 2011-08-03 MED ORDER — LEVOTHYROXINE SODIUM 75 MCG PO TABS
75.0000 ug | ORAL_TABLET | Freq: Every day | ORAL | Status: DC
  Administered 2011-08-03 – 2011-08-05 (×3): 75 ug via ORAL
  Filled 2011-08-03 (×4): qty 1

## 2011-08-03 MED ORDER — SENNA 8.6 MG PO TABS
1.0000 | ORAL_TABLET | Freq: Two times a day (BID) | ORAL | Status: DC
  Administered 2011-08-03 – 2011-08-05 (×5): 8.6 mg via ORAL
  Filled 2011-08-03 (×5): qty 1

## 2011-08-03 NOTE — Progress Notes (Signed)
Rx:  Brief Lovenox note  Wt= 69.8 kg   CrCl~37 ml/min  Adjusted Lovenox to 40mg  daily for DVT prophylaxis  [name] 08/03/2011 4:38 AM

## 2011-08-03 NOTE — ED Provider Notes (Signed)
History     CSN: [id number]  Arrival date & time 08/02/11  2056   First MD Initiated Contact with Patient 08/02/11 2122      Chief Complaint  Patient presents with  . Fall    (Consider location/radiation/quality/duration/timing/severity/associated sxs/prior treatment) HPI  Patient relates she was at a friend's having dinner and they were in a split-level house. She relates they were on a raised area for the dining room and she was walking around the table and her foot caught on the edge of the step and she fell landing on her left side. She denies hitting her head or loss of consciousness. She states she does have pain in her left abdomen and her left hip/buttock area. She states she has chronic pain in her back and that is unchanged. Patient presents via EMS.  Patient also describes burning in the floor of her feet for the past few months and states she has pain in her heels when she first stands up in the morning or when she first tries to walk. After exam it was discussed with patient this was most consistent with plantar fasciitis and she should use the gel heel pads.  PCP Dr. Jonny Ruiz  Past Medical History  Diagnosis Date  . FHx: migraine headaches   . ALLERGIC RHINITIS   . Hx of colonic polyps     hx of adenoma last 7/09  . Hyperlipidemia   . Osteoarthritis   . Peptic ulcer disease   . GERD (gastroesophageal reflux disease)   . Left knee DJD   . Right knee DJD   . Ileocecal valve syndrome   . IBS (irritable bowel syndrome)   . Anxiety   . Depression   . Lumbar spondylosis   . ALLERGIC RHINITIS 12/03/2006  . ANXIETY 05/31/2008  . CARPAL TUNNEL SYNDROME, BILATERAL 06/03/2007  . COLONIC POLYPS, HX OF 12/03/2006  . DEPRESSION 05/31/2008  . GERD 03/16/2007  . HYPERLIPIDEMIA 12/03/2006  . Irritable bowel syndrome 09/15/2007  . MIGRAINE HEADACHE 12/03/2006  . OSTEOARTHRITIS 12/03/2006  . PEPTIC ULCER DISEASE 12/03/2006  . Vitamin d deficiency 06/11/2010  . Cervical radicular pain  07/18/2010  . Hypertension   . HYPERTENSION 12/03/2006  . Hypothyroidism   . HYPOTHYROIDISM 12/03/2006  . Sinusitis   . OSTEOPOROSIS 11/2011t score -3.3  . Insomnia 04/23/2011    Past Surgical History  Procedure Date  . Thyroidectomy     02/2004  . Tonsillectomy     1937  . Back surgery 1990    LUMBAR LAMINECTOMY  . Right hand surgery trigger surgury   . S/p right hand surgury/trigger surgury     Dr. Myerdierck  . Appendectomy     1979  . Abdominal hysterectomy     TAH  . Breast biopsy     1970    Family History  Problem Relation Age of Onset  . Dementia Mother   . Hypertension Mother   . Alzheimer's disease Mother   . Leukemia Sister     History  Substance Use Topics  . Smoking status: Former Smoker    Types: Cigarettes    Quit date: 03/24/2004  . Smokeless tobacco: Former Neurosurgeon  . Alcohol Use: No  lives at home Lives with spouse  OB History    Grav Para Term Preterm Abortions TAB SAB Ect Mult Living   0               Review of Systems  All other systems reviewed and are negative.  Allergies  Tetracycline  Home Medications   Current Outpatient Rx  Name Route Sig Dispense Refill  . ATORVASTATIN CALCIUM 10 MG PO TABS  TAKE 1 TABLET EVERY DAY 30 tablet 11  . FLUTICASONE PROPIONATE 50 MCG/ACT NA SUSP Nasal Place 2 sprays into the nose daily. 16 g 2  . GABAPENTIN 100 MG PO CAPS Oral Take 1 capsule (100 mg total) by mouth 3 (three) times daily. 90 capsule 2  . LANSOPRAZOLE 30 MG PO CPDR  TAKE ONE CAPSULE BY MOUTH EVERY DAY 90 capsule 2  . LEVOTHYROXINE SODIUM 75 MCG PO TABS Oral Take 1 tablet (75 mcg total) by mouth daily. 90 tablet 3  . LISINOPRIL 5 MG PO TABS Oral Take 1 tablet (5 mg total) by mouth daily. 90 tablet 3  . ZOLPIDEM TARTRATE 5 MG PO TABS Oral Take 1 tablet (5 mg total) by mouth at bedtime as needed for sleep. 30 tablet 5    BP 143/71  Pulse 86  Temp(Src) 98.2 F (36.8 C) (Oral)  Resp 18  SpO2 97%  Vital signs normal     Physical Exam  Constitutional: She is oriented to person, place, and time. She appears well-developed and well-nourished.  Non-toxic appearance. She does not appear ill. No distress.  HENT:  Head: Normocephalic and atraumatic.  Right Ear: External ear normal.  Left Ear: External ear normal.  Nose: Nose normal. No mucosal edema or rhinorrhea.  Mouth/Throat: Oropharynx is clear and moist and mucous membranes are normal. No dental abscesses or uvula swelling.  Eyes: Conjunctivae and EOM are normal. Pupils are equal, round, and reactive to light.  Neck: Normal range of motion and full passive range of motion without pain. Neck supple.  Cardiovascular: Normal rate, regular rhythm and normal heart sounds.  Exam reveals no gallop and no friction rub.   No murmur heard. Pulmonary/Chest: Effort normal and breath sounds normal. No respiratory distress. She has no wheezes. She has no rhonchi. She has no rales. She exhibits no tenderness and no crepitus.  Abdominal: Soft. Normal appearance and bowel sounds are normal. She exhibits no distension. There is tenderness. There is no rebound and no guarding.       Patient has some pain to pouch patient in her left abdomen, her abdomen is distended but consistent with obese abdomen.  Musculoskeletal: Normal range of motion. She exhibits no edema and no tenderness.       Moves all extremities well. Patient is able to flex and extend at her hips and knees without pain. She is tender to palpation over her pubis and when I stress her pelvis.  Neurological: She is alert and oriented to person, place, and time. She has normal strength. No cranial nerve deficit.  Skin: Skin is warm, dry and intact. No rash noted. No erythema. No pallor.  Psychiatric: She has a normal mood and affect. Her speech is normal and behavior is normal. Her mood appears not anxious.    ED Course  Procedures (including critical care time)     Medications  morphine 4 MG/ML injection 4  mg (4 mg Intravenous Given 08/02/11 2231)  ondansetron (ZOFRAN) injection 4 mg (4 mg Intravenous Given 08/02/11 2231)  iohexol (OMNIPAQUE) 300 MG/ML solution 100 mL (100 mL Intravenous Contrast Given 08/03/11 0016)   0106 AM discussed CT abdominal/pelvis with Dr. Johna Sheriff who does not feel the CT is consistent with a trauma related injury specifically to the jejunum.   Patient was consult on by Dr. Thomasena Edis, on call  for orthopedics earlier in the evening.  01:22 Dr Cleora Fleet, team 1   Results for orders placed during the hospital encounter of 08/02/11  CBC      Component Value Range   WBC 11.6 (*) 4.0 - 10.5 (K/uL)   RBC 4.79  3.87 - 5.11 (MIL/uL)   Hemoglobin 13.3  12.0 - 15.0 (g/dL)   HCT 16.1  09.6 - 04.5 (%)   MCV 85.0  78.0 - 100.0 (fL)   MCH 27.8  26.0 - 34.0 (pg)   MCHC 32.7  30.0 - 36.0 (g/dL)   RDW 40.9  81.1 - 91.4 (%)   Platelets 183  150 - 400 (K/uL)  DIFFERENTIAL      Component Value Range   Neutrophils Relative 77  43 - 77 (%)   Lymphocytes Relative 16  12 - 46 (%)   Monocytes Relative 6  3 - 12 (%)   Eosinophils Relative 1  0 - 5 (%)   Basophils Relative 0  0 - 1 (%)   Neutro Abs 8.9 (*) 1.7 - 7.7 (K/uL)   Lymphs Abs 1.9  0.7 - 4.0 (K/uL)   Monocytes Absolute 0.7  0.1 - 1.0 (K/uL)   Eosinophils Absolute 0.1  0.0 - 0.7 (K/uL)   Basophils Absolute 0.0  0.0 - 0.1 (K/uL)  COMPREHENSIVE METABOLIC PANEL      Component Value Range   Sodium 140  135 - 145 (mEq/L)   Potassium 3.8  3.5 - 5.1 (mEq/L)   Chloride 106  96 - 112 (mEq/L)   CO2 21  19 - 32 (mEq/L)   Glucose, Bld 118 (*) 70 - 99 (mg/dL)   BUN 27 (*) 6 - 23 (mg/dL)   Creatinine, Ser 7.82 (*) 0.50 - 1.10 (mg/dL)   Calcium 9.3  8.4 - 95.6 (mg/dL)   Total Protein 7.1  6.0 - 8.3 (g/dL)   Albumin 3.7  3.5 - 5.2 (g/dL)   AST 29  0 - 37 (U/L)   ALT 21  0 - 35 (U/L)   Alkaline Phosphatase 99  39 - 117 (U/L)   Total Bilirubin 0.2 (*) 0.3 - 1.2 (mg/dL)   GFR calc non Af Amer 46 (*) >90 (mL/min)   GFR calc Af  Amer 53 (*) >90 (mL/min)   Laboratory interpretation all normal except renal insufficiency, leukocytosis   Dg Hip Complete Left  08/02/2011  *RADIOLOGY REPORT*  Clinical Data: Left hip and pelvic pain after fall.  LEFT HIP - COMPLETE 2+ VIEW  Comparison: None.  Findings: Acute appearing nondisplaced fracture of the superior and inferior left pubic rami.  The left hip appears intact without evidence of fracture or subluxation.  No focal bone lesion or bone destruction.  No abnormal periosteal reaction.  SI joints and symphysis pubis appear intact.  IMPRESSION: Nondisplaced acute fractures of the left superior and inferior pubic rami.  Original Report Authenticated By: Marlon Pel, M.D.   Ct Abdomen Pelvis W Contrast  08/03/2011  *RADIOLOGY REPORT*  Clinical Data: Buttock and groin pain after fall.  CT ABDOMEN AND PELVIS WITH CONTRAST  Technique:  Multidetector CT imaging of the abdomen and pelvis was performed following the standard protocol during bolus administration of intravenous contrast.  Contrast: OMNIPAQUE IOHEXOL 300 MG/ML  SOLN  Comparison: 03/06/2005  Findings: Calcified granuloma in the right lung base.  Small esophageal hiatal hernia.  Mild low attenuation change in the liver suggesting fatty infiltration.  Tiny low attenuation lesion in the inferior right lobe of  the liver measuring about 4 mm diameter.  This is stable since the previous study and likely represents a small cyst or hemangioma.  Tiny additional low attenuation lesions in the liver and spleen likely represent small cysts and are stable as well. Gallbladder, pancreas, and adrenal glands are unremarkable. Multiple cysts in both kidneys.  Largest cyst in the mid/upper pole region of the right kidney measures up to about 6 x 4 cm.  This is enlarging since the previous study.  There is suggestion of internal septation and possible enhancement.  Consider MRI for further evaluation to exclude underlying cystic renal cell  carcinoma.  No hydronephrosis in either kidney.  Calcification of the abdominal aorta without aneurysm.  No retroperitoneal lymphadenopathy.  No free air or free fluid in the abdomen.  The gastric wall is not thickened.  Mild fluid-filled dilatation of the proximal jejunum with transition zone in the mid abdomen.  This could represent localized ileus, peristalsis, or early obstruction. Stool filled colon without distension.  Pelvis:  Acute fractures are demonstrated of the left superior and inferior pubic rami with associated hematoma in the surrounding muscles.  Soft tissue contusion over the left hip.  The left hip is otherwise intact.  No bladder wall thickening.  No significant pelvic lymphadenopathy. No free or loculated pelvic fluid collections.  The uterus appears to be surgically absent.  No abnormal adnexal masses.  Appendix is surgically absent by history.  IMPRESSION:  1.  Fractures of the left superior and inferior pubic rami with associated hematomas. 2.  Enlarging cystic mass in the right kidney is indeterminate. Consider MRI for further evaluation. 3.  Nonspecific mild dilatation of the proximal jejunum may represent normal peristalsis versus localized ileus or early obstruction.  Consider follow-up if clinical symptoms are present. 4. Fatty infiltration of the liver.  Original Report Authenticated By: Marlon Pel, M.D.     1. Pelvic fracture   2. Fall   3. Hematoma of left hip    Plan admission  Devoria Albe, MD, FACEP    MDM          Ward Givens, MD 08/03/11 [phone]

## 2011-08-03 NOTE — H&P (Signed)
PCP:  Oliver Barre, MD, MD   DOA:  08/02/2011  8:58 PM  Chief Complaint:  Fall  HPI: Patient is 78 yo female with history of osteoporosis, hypertension, hyperlipidemia, peptic ulcer disease who presents to [facility] long emergency department after sustaining a fall at a friend's house. Patient reports she was at a friend's house having dinner, they have split level house, and as patient was walking around the table her foot caught on the edge of the table and she fell landing on her left side. She denies hitting her head or losing consciousness. Currently she reports having pain on the left side of the lower extremity extending to the left hip and left abdomen area. Patient reports she has history of chronic back pain but that has been relatively unchanged. Patient was brought to emergency department by emergency services. She currently denies chest pain, shortness of breath, no urinary concerns. Patient also denies fevers and chills, cough, recent sicknesses or hospitalization.  Allergies: Allergies  Allergen Reactions  . Tetracycline     "Lightheaded."    Prior to Admission medications   Medication Sig Start Date End Date Taking? Authorizing Provider  atorvastatin (LIPITOR) 10 MG tablet TAKE 1 TABLET EVERY DAY 07/29/11  Yes Corwin Levins, MD  fluticasone [facility]) 50 MCG/ACT nasal spray Place 2 sprays into the nose daily. 11/11/10 11/11/11 Yes Corwin Levins, MD  gabapentin (NEURONTIN) 100 MG capsule Take 1 capsule (100 mg total) by mouth 3 (three) times daily. 08/02/10 08/02/11 Yes Corwin Levins, MD  lansoprazole (PREVACID) 30 MG capsule TAKE ONE CAPSULE BY MOUTH EVERY DAY 05/25/11  Yes Corwin Levins, MD  levothyroxine (SYNTHROID) 75 MCG tablet Take 1 tablet (75 mcg total) by mouth daily. 12/24/10 12/24/11 Yes Corwin Levins, MD  lisinopril (PRINIVIL,ZESTRIL) 5 MG tablet Take 1 tablet (5 mg total) by mouth daily. 07/09/11  Yes Corwin Levins, MD  zolpidem (AMBIEN) 5 MG tablet Take 1 tablet (5 mg total) by mouth  at bedtime as needed for sleep. 04/23/11 04/22/12 Yes Corwin Levins, MD    Past Medical History  Diagnosis Date  . FHx: migraine headaches   . ALLERGIC RHINITIS   . Hx of colonic polyps     hx of adenoma last 7/09  . Hyperlipidemia   . Osteoarthritis   . Peptic ulcer disease   . GERD (gastroesophageal reflux disease)   . Left knee DJD   . Right knee DJD   . Ileocecal valve syndrome   . IBS (irritable bowel syndrome)   . Anxiety   . Depression   . Lumbar spondylosis   . CARPAL TUNNEL SYNDROME, BILATERAL 06/03/2007  . Vitamin d deficiency 06/11/2010  . Hypothyroidism   . OSTEOPOROSIS 11/2011t score -3.3  . Insomnia 04/23/2011    Past Surgical History  Procedure Date  . Thyroidectomy     02/2004  . Tonsillectomy     1937  . Back surgery 1990    LUMBAR LAMINECTOMY  . Right hand surgery trigger surgury   . S/p right hand surgury/trigger surgury     Dr. Myerdierck  . Appendectomy     1979  . Abdominal hysterectomy     TAH  . Breast biopsy     1970    Social History:  reports that she quit smoking about 7 years ago. Her smoking use included Cigarettes. She has quit using smokeless tobacco. She reports that she does not drink alcohol. Her drug history not on file.  Family History  Problem Relation Age of Onset  . Dementia Mother   . Hypertension Mother   . Alzheimer's disease Mother   . Leukemia Sister     Review of Systems:  Constitutional: Denies fever, chills, diaphoresis, appetite change and fatigue.  HEENT: Denies photophobia, eye pain, redness, hearing loss, ear pain, congestion, sore throat, rhinorrhea, sneezing, mouth sores, trouble swallowing, neck pain, neck stiffness and tinnitus.   Respiratory: Denies SOB, DOE, cough, chest tightness,  and wheezing.   Cardiovascular: Denies chest pain, palpitations and leg swelling.  Gastrointestinal: Denies nausea, vomiting, abdominal pain, diarrhea, constipation, blood in stool and abdominal distention.  Genitourinary:  Denies dysuria, urgency, frequency, hematuria, flank pain and difficulty urinating.  Musculoskeletal: Denies joint swelling, arthralgias and gait problem.  Neurological: Denies dizziness, seizures, syncope, numbness and headaches.  Hematological: Denies adenopathy. Easy bruising, personal or family bleeding history  Psychiatric/Behavioral: Denies suicidal ideation, mood changes, confusion, nervousness, sleep disturbance and agitation  Physical Exam:  Filed Vitals:   08/02/11 2058  BP: 143/71  Pulse: 86  Temp: 98.2 F (36.8 C)  TempSrc: Oral  Resp: 18  SpO2: 97%    Constitutional: Vital signs reviewed.  Patient is in no acute distress and cooperative with exam. Alert and oriented to name and place Head: Normocephalic and atraumatic Ear: TM normal bilaterally Mouth: no erythema or exudates, dry MM Eyes: PERRL, EOMI, conjunctivae normal, No scleral icterus.  Neck: Supple, Trachea midline normal ROM, No JVD, mass, thyromegaly, or carotid bruit present.  Cardiovascular: RRR, S1 normal, S2 normal, no MRG, pulses symmetric and intact bilaterally Pulmonary/Chest: CTAB but decreased sounds slightly at the bases, no wheezes, rales, or rhonchi Abdominal: Soft. Non-tender, non-distended, bowel sounds are normal, no masses, organomegaly, or guarding present.  Musculoskeletal: No joint deformities, erythema, or stiffness, ROM full and no nontender Ext: no edema and no cyanosis, pulses palpable bilaterally (DP and PT) Hematology: no cervical, inginal, or axillary adenopathy.  Neurological: pt refuses to participate due to pain, no focal deficits noted  Labs on Admission:      Component Value Range   WBC 11.6 (*) 4.0 - 10.5 (K/uL)   RBC 4.79  3.87 - 5.11 (MIL/uL)   Hemoglobin 13.3  12.0 - 15.0 (g/dL)   HCT 64.4  03.4 - 74.2 (%)   MCV 85.0  78.0 - 100.0 (fL)   MCH 27.8  26.0 - 34.0 (pg)   MCHC 32.7  30.0 - 36.0 (g/dL)   RDW 59.5  63.8 - 75.6 (%)   Platelets 183  150 - 400 (K/uL)       Component Value Range   Sodium 140  135 - 145 (mEq/L)   Potassium 3.8  3.5 - 5.1 (mEq/L)   Chloride 106  96 - 112 (mEq/L)   CO2 21  19 - 32 (mEq/L)   Glucose, Bld 118 (*) 70 - 99 (mg/dL)   BUN 27 (*) 6 - 23 (mg/dL)   Creatinine, Ser 4.33 (*) 0.50 - 1.10 (mg/dL)   Calcium 9.3  8.4 - 29.5 (mg/dL)   Total Protein 7.1  6.0 - 8.3 (g/dL)   Albumin 3.7  3.5 - 5.2 (g/dL)   AST 29  0 - 37 (U/L)   ALT 21  0 - 35 (U/L)   Alkaline Phosphatase 99  39 - 117 (U/L)   Total Bilirubin 0.2  0.3 - 1.2 (mg/dL)   GFR calc non Af Amer 46 (*) >90 (mL/min)   GFR calc Af Amer 53 (*) >90 (mL/min)    Radiological Exams  on Admission:  Left Hip XRAY: 08/02/2011 IMPRESSION:  Nondisplaced acute fractures of the left superior and inferior pubic rami.  CT Abdomen and Pelvis: 08/02/2011 IMPRESSION:  1. Fractures of the left superior and inferior pubic rami with associated hematomas.  2. Enlarging cystic mass in the right kidney is indeterminate.  3. Nonspecific mild dilatation of the proximal jejunum may represent normal peristalsis versus localized ileus or early obstruction. Consider follow-up if clinical symptoms are present.  4. Fatty infiltration of the liver.  Assessment/Plan  Fall - Please note the above findings on x-ray of the left hip - Per orthopedic recommendation left pubic rami fracture is stable, patient may weight-bear as tolerated - Physical therapy was ordered - Will provide supportive care with adequate analgesia for pain control - Will place order for TED hose PAS stockings while in bed with Lovenox for DVT prophylaxis  Acute on Chronic renal failure - Patient has chronic renal failure and per GFR consistent with stage III chronic kidney disease - Creatinine appears to be a patient's baseline - Will provide gentle hydration with normal saline - Obtain BMP in the morning  HTN - This appears to be stable - will continue lisinopril as per home medication regimen  Abdominal pain -  Unclear etiology at this time and perhaps related to fall - CT of the abdomen and pelvis noted questionable ileus, small bowel obstruction - This was discussed with surgeon on call, Dr. Johna Sheriff and the recommendation was to proceed with medical management at this time as this may be an over read - Will provide gentle hydration, analgesia for adequate pain control, anti-emetics as needed - Will also provide bowel regimen  Hyperglycemia - Check A1C  Hypothyroidism -  Check TSH - Continue Synthroid as per home medication regimen  Leukocytosis - Mild, most likely reactive and secondary to fall - Obtain CBC in the morning  DVT Prophylaxis - Lovenox  Code Status - Full  Education  - test results and diagnostic studies were discussed with patient  - patient verbalized the understanding - questions were answered at the bedside and contact information was provided for additional questions or concerns  Time Spent on Admission: Over 30 minutes  MAGICK-Kenrick Pore 08/03/2011, 1:54 AM  [facility] Pager # [phone] Main Office # [phone]

## 2011-08-03 NOTE — Progress Notes (Signed)
Patient admitted earlier this am with complaints of left hip/groin pain following a mechanical fall. Found to have fractures of the left superior and inferior pubic rami. Awaiting PT/OT consults for further recommendations. Pain control as needed. Will continue to follow for DC needs. BP well controlled.  Peggye Pitt, MD [facility] Pager: [phone]

## 2011-08-04 DIAGNOSIS — S32592A Other specified fracture of left pubis, initial encounter for closed fracture: Secondary | ICD-10-CM

## 2011-08-04 DIAGNOSIS — I10 Essential (primary) hypertension: Secondary | ICD-10-CM

## 2011-08-04 DIAGNOSIS — S72009A Fracture of unspecified part of neck of unspecified femur, initial encounter for closed fracture: Secondary | ICD-10-CM

## 2011-08-04 DIAGNOSIS — K560 Paralytic ileus: Secondary | ICD-10-CM

## 2011-08-04 DIAGNOSIS — E119 Type 2 diabetes mellitus without complications: Secondary | ICD-10-CM

## 2011-08-04 NOTE — Progress Notes (Signed)
Subjective: Hip pain slight better, but not up with PT yet. Feels backed up, No BM yet  Objective: Vital signs in last 24 hours: Temp:  [97.4 F (36.3 C)-98.8 F (37.1 C)] 98.8 F (37.1 C) (05/13 0501) Pulse Rate:  [64-81] 77  (05/13 0501) Resp:  [18] 18  (05/13 0501) BP: (115-138)/(77-87) 138/87 mmHg (05/13 0501) SpO2:  [91 %-98 %] 91 % (05/13 0501)  Intake/Output from previous day: 05/12 0701 - 05/13 0700 In: 1929.2 [P.O.:720; I.V.:1209.2] Out: 2350 [Urine:2350] Intake/Output this shift: Total I/O In: 1209.2 [I.V.:1209.2] Out: 1350 [Urine:1350]   Basename 08/03/11 0252 08/02/11 2230  HGB 12.4 13.3    Basename 08/03/11 0252 08/02/11 2230  WBC 6.7 11.6*  RBC 4.41 4.79  HCT 37.2 40.7  PLT 166 183    Basename 08/03/11 0252 08/02/11 2230  NA 138 140  K 4.1 3.8  CL 106 106  CO2 19 21  BUN 25* 27*  CREATININE 1.00 1.12*  GLUCOSE 129* 118*  CALCIUM 8.8 9.3   No results found for this basename: LABPT:2,INR:2 in the last 72 hours  Cons alert approp, abd full +BS, legs NMVI, sore left buttock area wit movement.   Assessment/Plan: HD#2 Left pelvic Rami fx stable Constipation LOC/EOC OOB with PT today. Ortho would like to see her in Office in 3 weeks for eval. Call [phone].   Julie Fritz 08/04/2011, 6:51 AM

## 2011-08-04 NOTE — Progress Notes (Signed)
Clinical Social Work Department BRIEF PSYCHOSOCIAL ASSESSMENT 08/04/2011  Patient:  Julie Fritz, Julie Fritz     Account Number:  [id number]     Admit date:  08/02/2011  Clinical Social Worker:  Candie Chroman  Date/Time:  08/04/2011 03:30 PM  Referred by:  Physician  Date Referred:  08/04/2011 Referred for  SNF Placement   Other Referral:   Interview type:  Patient Other interview type:    PSYCHOSOCIAL DATA Living Status:  HUSBAND Admitted from facility:   Level of care:   Primary support name:  Judee Clara Primary support relationship to patient:  SPOUSE Degree of support available:   supportive    CURRENT CONCERNS Current Concerns  Post-Acute Placement   Other Concerns:    SOCIAL WORK ASSESSMENT / PLAN Pt is a 78 yr old female living at home prior to hospitalization. Met with pt/spouse to assist with d/c planning. PT has recommended [facility] SNF placement and pt/family are in agreement with plan. Prior approval from [facility] is required for placement. CSW will follow to assist with d/c planning to SNF and insurance authorization.   Assessment/plan status:  Psychosocial Support/Ongoing Assessment of Needs Other assessment/ plan:   Information/referral to community resources:    PATIENT'S/FAMILY'S RESPONSE TO PLAN OF CARE: Pt/spouse are interested in [facility] for ST SNF placement.     Cori Razor  LCSW

## 2011-08-04 NOTE — Evaluation (Signed)
Occupational Therapy Evaluation Patient Details Name: Julie Fritz MRN: [id number] DOB: 12/06/1930 Today's Date: 08/04/2011 Time: 7829-5621 OT Time Calculation (min): 23 min  OT Assessment / Plan / Recommendation Clinical Impression  Pt presents to OT s/p fall. Pt will benefit from skilled OT to increase I with ADL activity and return home with spouse.    OT Assessment  Patient needs continued OT Services    Follow Up Recommendations  Skilled nursing facility - Pt wants [facility]      Equipment Recommendations  Defer to next venue       Frequency  Min 2X/week    Precautions / Restrictions Precautions Precautions: Fall Restrictions Weight Bearing Restrictions: Yes RLE Weight Bearing: Weight bearing as tolerated LLE Weight Bearing: Weight bearing as tolerated       ADL  Eating/Feeding: Simulated;Set up Where Assessed - Eating/Feeding: Chair Grooming: Simulated;Set up Where Assessed - Grooming: Supported sitting Upper Body Bathing: Simulated;Set up Where Assessed - Upper Body Bathing: Supported;Sitting, bed Lower Body Bathing: Simulated;Maximal assistance Where Assessed - Lower Body Bathing: Sit to stand from bed Upper Body Dressing: Performed;Set up Where Assessed - Upper Body Dressing: Sitting, bed;Unsupported Lower Body Dressing: Simulated;Maximal assistance Where Assessed - Lower Body Dressing: Sit to stand from bed Toilet Transfer Method: Not assessed Toileting - Clothing Manipulation: Simulated;Maximal assistance Where Assessed - Toileting Clothing Manipulation: Standing Toileting - Hygiene: Simulated;Maximal assistance Where Assessed - Toileting Hygiene: Standing Tub/Shower Transfer: Not assessed Ambulation Related to ADLs: Co eval with OT. Pt in significant pain with taking steps and tended to leave heavily on left.   ADL Comments: Pt will need A with ADL's .Pt is agreeable to SNF    OT Diagnosis: Acute pain;Generalized weakness  OT Problem List:  Decreased activity tolerance;Pain;Decreased strength OT Treatment Interventions: Self-care/ADL training;DME and/or AE instruction;Patient/family education   OT Goals Acute Rehab OT Goals OT Goal Formulation: With patient Time For Goal Achievement: 08/18/11 Potential to Achieve Goals: Good ADL Goals Pt Will Perform Grooming: with supervision;Standing at sink ADL Goal: Grooming - Progress: Goal set today Pt Will Transfer to Toilet: with min assist;Comfort height toilet ADL Goal: Toilet Transfer - Progress: Goal set today Pt Will Perform Toileting - Clothing Manipulation: with min assist;Standing ADL Goal: Toileting - Clothing Manipulation - Progress: Goal set today Pt Will Perform Toileting - Hygiene: with min assist;Standing at 3-in-1/toilet ADL Goal: Toileting - Hygiene - Progress: Goal set today  Visit Information  Assistance Needed: +2 PT/OT Co-Evaluation/Treatment: Yes       Prior Functioning  Home Living Lives With: Spouse Available Help at Discharge: Family;Available 24 hours/day Type of Home: Apartment Home Access: Level entry Home Layout: One level Bathroom Shower/Tub: Engineer, manufacturing systems: Standard (has something she can reach for to get up) Home Adaptive Equipment: None Prior Function Level of Independence: Independent Able to Take Stairs?: Yes (holding onto something she can) Driving: No Vocation: Retired Musician: No difficulties Dominant Hand: Right    Cognition  Overall Cognitive Status: Appears within functional limits for tasks assessed/performed Arousal/Alertness: Awake/alert Orientation Level: Appears intact for tasks assessed Behavior During Session: Anxious Cognition - Other Comments: very anxious, needs to take deep breaths throughtout session to help her relax.      Extremity/Trunk Assessment Right Upper Extremity Assessment RUE ROM/Strength/Tone: Within functional levels Left Upper Extremity Assessment LUE  ROM/Strength/Tone: Within functional levels Right Lower Extremity Assessment RLE ROM/Strength/Tone: WFL for tasks assessed Left Lower Extremity Assessment LLE ROM/Strength/Tone: Deficits LLE ROM/Strength/Tone Deficits: grossly 3/5 hip, 3+/5 knee and  ankle.   LLE Sensation: Deficits LLE Sensation Deficits: reports pain and tingling into her thigh.  Also reports pain with coughing    Mobility Bed Mobility Bed Mobility: Supine to Sit;Sitting - Scoot to Edge of Bed Supine to Sit: 2: Max assist;HOB elevated;With rails Sitting - Scoot to Edge of Bed: 2: Max assist Details for Bed Mobility Assistance: max verbal cues for hand placement and rest breaks to breathe.  Max assist to support trunk and help patient weight shift to EOB.   Transfers Sit to Stand: 1: +2 Total assist;With upper extremity assist;From elevated surface;From bed Sit to Stand: Patient Percentage: 50% Stand to Sit: 1: +2 Total assist;With upper extremity assist;To elevated surface;To chair/3-in-1;With armrests Stand to Sit: Patient Percentage: 50% Details for Transfer Assistance: assist needed to support trunk over painful legs.       Balance Static Sitting Balance Static Sitting - Balance Support: Bilateral upper extremity supported;Feet unsupported Static Sitting - Level of Assistance: 4: Min assist Static Sitting - Comment/# of Minutes: patient's legs are so short that she is practically standing once feet touch the floor.  Sitting balance on compliant surface min assist to prevent posterior LOB.    End of Session OT - End of Session Equipment Utilized During Treatment: Gait belt Activity Tolerance: Patient limited by pain   Zyden Suman, [place] Kung 08/04/2011, 12:07 PM

## 2011-08-04 NOTE — Progress Notes (Signed)
Subjective: Still complaining of hip pain.  Objective: Vital signs in last 24 hours: Temp:  [98 F (36.7 C)-98.8 F (37.1 C)] 98 F (36.7 C) (05/13 1458) Pulse Rate:  [67-77] 67  (05/13 1458) Resp:  [18] 18  (05/13 1458) BP: (116-141)/(77-87) 118/85 mmHg (05/13 1458) SpO2:  [91 %-98 %] 98 % (05/13 1458) Weight change:  Last BM Date: 08/02/11  Intake/Output from previous day: 05/12 0701 - 05/13 0700 In: 1929.2 [P.O.:720; I.V.:1209.2] Out: 2350 [Urine:2350] Total I/O In: 240 [P.O.:240] Out: 700 [Urine:700]   Physical Exam: General: Alert, awake, oriented x3, in no acute distress. HEENT: No bruits, no goiter. Heart: Regular rate and rhythm, without murmurs, rubs, gallops. Lungs: Clear to auscultation bilaterally. Abdomen: Soft, nontender, nondistended, positive bowel sounds. Extremities: No clubbing cyanosis or edema with positive pedal pulses. Neuro: Grossly intact, nonfocal.    Lab Results: Basic Metabolic Panel:  Basename 08/03/11 0252 08/02/11 2230  NA 138 140  K 4.1 3.8  CL 106 106  CO2 19 21  GLUCOSE 129* 118*  BUN 25* 27*  CREATININE 1.00 1.12*  CALCIUM 8.8 9.3  MG 1.8 --  PHOS 3.6 --   Liver Function Tests:  Basename 08/02/11 2230  AST 29  ALT 21  ALKPHOS 99  BILITOT 0.2*  PROT 7.1  ALBUMIN 3.7   CBC:  Basename 08/03/11 0252 08/02/11 2230  WBC 6.7 11.6*  NEUTROABS -- 8.9*  HGB 12.4 13.3  HCT 37.2 40.7  MCV 84.4 85.0  PLT 166 183   CBG:  Basename 08/04/11 0736 08/03/11 0728  GLUCAP 93 135*   Hemoglobin A1C:  Basename 08/03/11 0252  HGBA1C 6.4*   Thyroid Function Tests:  Basename 08/03/11 0252  TSH 1.097  T4TOTAL --  FREET4 --  T3FREE --  THYROIDAB --   Studies/Results: Dg Hip Complete Left  08/02/2011  *RADIOLOGY REPORT*  Clinical Data: Left hip and pelvic pain after fall.  LEFT HIP - COMPLETE 2+ VIEW  Comparison: None.  Findings: Acute appearing nondisplaced fracture of the superior and inferior left pubic rami.  The  left hip appears intact without evidence of fracture or subluxation.  No focal bone lesion or bone destruction.  No abnormal periosteal reaction.  SI joints and symphysis pubis appear intact.  IMPRESSION: Nondisplaced acute fractures of the left superior and inferior pubic rami.  Original Report Authenticated By: Marlon Pel, M.D.   Ct Abdomen Pelvis W Contrast  08/03/2011  *RADIOLOGY REPORT*  Clinical Data: Buttock and groin pain after fall.  CT ABDOMEN AND PELVIS WITH CONTRAST  Technique:  Multidetector CT imaging of the abdomen and pelvis was performed following the standard protocol during bolus administration of intravenous contrast.  Contrast: OMNIPAQUE IOHEXOL 300 MG/ML  SOLN  Comparison: 03/06/2005  Findings: Calcified granuloma in the right lung base.  Small esophageal hiatal hernia.  Mild low attenuation change in the liver suggesting fatty infiltration.  Tiny low attenuation lesion in the inferior right lobe of the liver measuring about 4 mm diameter.  This is stable since the previous study and likely represents a small cyst or hemangioma.  Tiny additional low attenuation lesions in the liver and spleen likely represent small cysts and are stable as well. Gallbladder, pancreas, and adrenal glands are unremarkable. Multiple cysts in both kidneys.  Largest cyst in the mid/upper pole region of the right kidney measures up to about 6 x 4 cm.  This is enlarging since the previous study.  There is suggestion of internal septation and possible enhancement.  Consider MRI for further evaluation to exclude underlying cystic renal cell carcinoma.  No hydronephrosis in either kidney.  Calcification of the abdominal aorta without aneurysm.  No retroperitoneal lymphadenopathy.  No free air or free fluid in the abdomen.  The gastric wall is not thickened.  Mild fluid-filled dilatation of the proximal jejunum with transition zone in the mid abdomen.  This could represent localized ileus, peristalsis, or  early obstruction. Stool filled colon without distension.  Pelvis:  Acute fractures are demonstrated of the left superior and inferior pubic rami with associated hematoma in the surrounding muscles.  Soft tissue contusion over the left hip.  The left hip is otherwise intact.  No bladder wall thickening.  No significant pelvic lymphadenopathy. No free or loculated pelvic fluid collections.  The uterus appears to be surgically absent.  No abnormal adnexal masses.  Appendix is surgically absent by history.  IMPRESSION:  1.  Fractures of the left superior and inferior pubic rami with associated hematomas. 2.  Enlarging cystic mass in the right kidney is indeterminate. Consider MRI for further evaluation. 3.  Nonspecific mild dilatation of the proximal jejunum may represent normal peristalsis versus localized ileus or early obstruction.  Consider follow-up if clinical symptoms are present. 4. Fatty infiltration of the liver.  Original Report Authenticated By: Marlon Pel, M.D.    Medications: Scheduled Meds:    . atorvastatin  20 mg Oral q1800  . enoxaparin  40 mg Subcutaneous Q24H  . fluticasone  2 spray Each Nare Daily  . levothyroxine  75 mcg Oral QAC breakfast  . lisinopril  5 mg Oral Daily  . pantoprazole  20 mg Oral Q1200  . senna  1 tablet Oral BID   Continuous Infusions:  PRN Meds:.HYDROcodone-acetaminophen, morphine injection, ondansetron (ZOFRAN) IV, ondansetron, polyethylene glycol, zolpidem  Assessment/Plan:  Principal Problem:  *Bilateral pubic rami fractures: PT OT evaluations are recommending skilled nursing facility placement. Social work will be faxing patient out today and hopefully we will have bed offers tomorrow. We'll continue to follow.    LOS: 2 days   [facility] Pager: [phone] 08/04/2011, 4:16 PM

## 2011-08-04 NOTE — Progress Notes (Signed)
Clinical Social Work Department CLINICAL SOCIAL WORK PLACEMENT NOTE 08/04/2011  Patient:  Julie Fritz, Julie Fritz  Account Number:  [id number] Admit date:  08/02/2011  Clinical Social Worker:  Cori Razor, LCSW  Date/time:  08/04/2011 03:36 PM  Clinical Social Work is seeking post-discharge placement for this patient at the following level of care:   SKILLED NURSING   (*CSW will update this form in Epic as items are completed)   08/04/2011  Patient/family provided with [facility] Department of Clinical Social Work's list of facilities offering this level of care within the geographic area requested by the patient (or if unable, by the patient's family).  08/04/2011  Patient/family informed of their freedom to choose among providers that offer the needed level of care, that participate in Medicare, Medicaid or managed care program needed by the patient, have an available bed and are willing to accept the patient.    Patient/family informed of [facility]' ownership interest in [facility], as well as of the fact that they are under no obligation to receive care at this facility.  PASARR submitted to EDS on 08/04/2011 PASARR number received from EDS on   FL2 transmitted to all facilities in geographic area requested by pt/family on  08/04/2011 FL2 transmitted to all facilities within larger geographic area on   Patient informed that his/her managed care company has contracts with or will negotiate with  certain facilities, including the following:     Patient/family informed of bed offers received:   Patient chooses bed at  Physician recommends and patient chooses bed at    Patient to be transferred to  on   Patient to be transferred to facility by   The following physician request were entered in Epic:   Additional Comments:  Cori Razor LCSW [phone]

## 2011-08-04 NOTE — Progress Notes (Signed)
Physical Therapy Evaluation  08/04/11 1027  PT Visit Information  Last PT Received On 08/04/11  Assistance Needed +2 (safety and chair to follow)  PT/OT Co-Evaluation/Treatment Yes  PT Time Calculation  PT Start Time 1025  PT Stop Time 1048  PT Time Calculation (min) 23 min  Subjective Data  Subjective The patient reports history of low back surgery and problems.    Patient Stated Goal to get strong enough to go home.    Precautions  Precautions Fall  Restrictions  Weight Bearing Restrictions Yes  RLE Weight Bearing WBAT  LLE Weight Bearing WBAT  Home Living  Lives With Spouse  Available Help at Discharge Family;Available 24 hours/day  Type of Home Apartment  Home Access Level entry  Home Layout One level  Bathroom Publishing copy (has something she can reach for to get up)  Home Adaptive Equipment None  Prior Function  Level of Independence Independent  Able to Take Stairs? Yes (holding onto something she can)  Driving No  Vocation Retired  Geneticist, molecular No difficulties  Cognition  Overall Cognitive Status Appears within functional limits for tasks assessed/performed  Arousal/Alertness Awake/alert  Orientation Level Appears intact for tasks assessed  Behavior During Session Anxious  Cognition - Other Comments very anxious, needs to take deep breaths throughtout session to help her relax.    Right Lower Extremity Assessment  RLE ROM/Strength/Tone [facility] for tasks assessed  Left Lower Extremity Assessment  LLE ROM/Strength/Tone Deficits  LLE ROM/Strength/Tone Deficits grossly 3/5 hip, 3+/5 knee and ankle.    LLE Sensation Deficits  LLE Sensation Deficits reports pain and tingling into her thigh.  Also reports pain with coughing   Bed Mobility  Bed Mobility Supine to Sit;Sitting - Scoot to Edge of Bed  Supine to Sit 2: Max assist;HOB elevated;With rails  Sitting - Scoot to Edge of Bed 2: Max assist  Details for Bed  Mobility Assistance max verbal cues for hand placement and rest breaks to breathe.  Max assist to support trunk and help patient weight shift to EOB.    Transfers  Transfers Sit to Stand;Stand to Sit  Sit to Stand 1: +2 Total assist;With upper extremity assist;From elevated surface;From bed  Sit to Stand: Patient Percentage 50%  Stand to Sit 1: +2 Total assist;With upper extremity assist;To elevated surface;To chair/3-in-1;With armrests  Stand to Sit: Patient Percentage 50%  Details for Transfer Assistance assist needed to support trunk over painful legs.   Ambulation/Gait  Ambulation/Gait Assistance 1: +2 Total assist  Ambulation/Gait: Patient Percentage 50%  Ambulation Distance (Feet) 8 Feet  Assistive device Rolling walker  Ambulation/Gait Assistance Details max verbal cues for leg sequencing and safe use of RW. Patient with flexed trunk and leaning to the left with progression of right foot.    Gait Pattern Step-to pattern;Antalgic;Lateral trunk lean to left;Trunk flexed  Gait velocity significantly less than 1.8 ft/sec which puts her at risk for recurrent falls.    Static Sitting Balance  Static Sitting - Balance Support Bilateral upper extremity supported;Feet unsupported  Static Sitting - Level of Assistance 4: Min assist  Static Sitting - Comment/# of Minutes patient's legs are so short that she is practically standing once feet touch the floor.  Sitting balance on compliant surface min assist to prevent posterior LOB.    PT - End of Session  Equipment Utilized During Treatment Gait belt (RW)  Activity Tolerance Patient limited by pain (anxiety)  Patient left in chair;with call bell/phone within  reach;with family/visitor present (husband)  Nurse Communication Mobility status  PT Assessment  Clinical Impression Statement 78 y.o. female admitted to [facility] for fall with stable pelvic fracture.  She presents today with significant increase in left buttocks, sacral, hip and thigh pain.  She also reports pain and tingling in her left tigh and has significant h/o for lumbar surgery and back issues.  She has decreased balance, decreased tolerance of activity, decreased ability to walk and need for education on safety with mobility and gait.  She would benefit from skilled PT to maximize her independence, functional mobility and safety so that she may be able to return home after extensive SNF level rehab with her husband's assist at discharge.    PT Recommendation/Assessment Patient needs continued PT services  PT Problem List Decreased strength;Decreased activity tolerance;Decreased balance;Decreased mobility;Decreased knowledge of use of DME;Pain  PT Therapy Diagnosis  Difficulty walking;Abnormality of gait;Generalized weakness;Acute pain  PT Plan  PT Frequency Min 5X/week  PT Treatment/Interventions DME instruction;Gait training;Functional mobility training;Therapeutic activities;Therapeutic exercise;Balance training;Neuromuscular re-education;Patient/family education  PT Recommendation  Follow Up Recommendations Skilled nursing facility  Equipment Recommended Defer to next venue  Individuals Consulted  Consulted and Agree with Results and Recommendations Patient;Family member/caregiver  Family Member Consulted husband  Acute Rehab PT Goals  PT Goal Formulation With patient/family  Time For Goal Achievement 08/18/11  Potential to Achieve Goals Good  Pt will go Supine/Side to Sit with modified independence;with HOB 0 degrees;with rail  PT Goal: Supine/Side to Sit - Progress Goal set today  Pt will go Sit to Supine/Side with modified independence;with HOB 0 degrees;with rail  PT Goal: Sit to Supine/Side - Progress Goal set today  Pt will go Sit to Stand with min assist  PT Goal: Sit to Stand - Progress Goal set today  Pt will go Stand to Sit with min assist;with upper extremity assist  PT Goal: Stand to Sit - Progress Goal set today  Pt will Transfer Bed to Chair/Chair to Bed  with min assist  PT Transfer Goal: Bed to Chair/Chair to Bed - Progress Goal set today  Pt will Ambulate 51 - 150 feet;with min assist;with rolling walker  PT Goal: Ambulate - Progress Goal set today  Written Expression  Dominant Hand Right   Reyes Fifield B. Kaesyn Johnston, PT, DPT [phone]

## 2011-08-04 NOTE — Care Management Note (Signed)
    Page 1 of 2   08/04/2011     3:32:18 PM   CARE MANAGEMENT NOTE 08/04/2011  Patient:  Julie Fritz, Julie Fritz   Account Number:  [id number]  Date Initiated:  08/04/2011  Documentation initiated by:  Colleen Can  Subjective/Objective Assessment:   dx  pt fell and sustaining pelvic fracture (lnondisplaced acute fractures of left superior and infrieor pubic ramus fractures     Action/Plan:   CM spoke with patient . Pt wishes to go to Skilled facility for rehab.  This has been refeerd to CSW   Anticipated DC Date:  08/05/2011   Anticipated DC Plan:  SKILLED NURSING FACILITY  In-house referral  Clinical Social Worker      DC Planning Services  CM consult      [facility] Choice  NA   Choice offered to / List presented to:  NA   DME arranged  NA      DME agency  NA     HH arranged  NA      Status of service:  Completed, signed off Medicare Important Message given?   (If response is "NO", the following Medicare IM given date fields will be blank) Date Medicare IM given:   Date Additional Medicare IM given:    Discharge Disposition:    Per UR Regulation:  Reviewed for med. necessity/level of care/duration of stay  If discussed at Long Length of Stay Meetings, dates discussed:    Comments:

## 2011-08-05 DIAGNOSIS — I10 Essential (primary) hypertension: Secondary | ICD-10-CM

## 2011-08-05 DIAGNOSIS — K560 Paralytic ileus: Secondary | ICD-10-CM

## 2011-08-05 DIAGNOSIS — S72009A Fracture of unspecified part of neck of unspecified femur, initial encounter for closed fracture: Secondary | ICD-10-CM

## 2011-08-05 DIAGNOSIS — E119 Type 2 diabetes mellitus without complications: Secondary | ICD-10-CM

## 2011-08-05 DIAGNOSIS — M810 Age-related osteoporosis without current pathological fracture: Secondary | ICD-10-CM

## 2011-08-05 MED ORDER — HYDROCODONE-ACETAMINOPHEN 5-325 MG PO TABS: 1.0000 | ORAL_TABLET | ORAL | Status: AC | PRN

## 2011-08-05 NOTE — Discharge Summary (Signed)
Physician Discharge Summary  Patient ID: Julie Fritz MRN: [id number] DOB/AGE: Jul 14, 1930 78 y.o.  Admit date: 10-Aug-2011 Discharge date: 08/05/2011  Primary Care Physician:  Oliver Barre, MD, MD   Discharge Diagnoses:    Principal Problem:  *Bilateral pubic rami fractures Active Problems:  HYPOTHYROIDISM  HYPERLIPIDEMIA  Osteoporosis    Medication List  As of 08/05/2011 11:14 AM   STOP taking these medications         cephALEXin 500 MG capsule      gabapentin 100 MG capsule         TAKE these medications         atorvastatin 10 MG tablet   Commonly known as: LIPITOR   TAKE 1 TABLET EVERY DAY      fluticasone 50 MCG/ACT nasal spray   Commonly known as: FLONASE   Place 2 sprays into the nose daily.      HYDROcodone-acetaminophen 5-325 MG per tablet   Commonly known as: NORCO   Take 1-2 tablets by mouth every 4 (four) hours as needed.      lansoprazole 30 MG capsule   Commonly known as: PREVACID   TAKE ONE CAPSULE BY MOUTH EVERY DAY      levothyroxine 75 MCG tablet   Commonly known as: SYNTHROID, LEVOTHROID   Take 1 tablet (75 mcg total) by mouth daily.      lisinopril 5 MG tablet   Commonly known as: PRINIVIL,ZESTRIL   Take 1 tablet (5 mg total) by mouth daily.      zolpidem 5 MG tablet   Commonly known as: AMBIEN   Take 1 tablet (5 mg total) by mouth at bedtime as needed for sleep.             Disposition and Follow-up:  Will be discharged to SNF today in stable and improved condition for short-term rehab purposes.  Consults:  orthopedic surgery Dr. Thomasena Edis   Significant Diagnostic Studies:   Dg Hip Complete Left  08/10/11  *RADIOLOGY REPORT*  Clinical Data: Left hip and pelvic pain after fall.  LEFT HIP - COMPLETE 2+ VIEW  Comparison: None.  Findings: Acute appearing nondisplaced fracture of the superior and inferior left pubic rami.  The left hip appears intact without evidence of fracture or subluxation.  No focal bone lesion or bone  destruction.  No abnormal periosteal reaction.  SI joints and symphysis pubis appear intact.  IMPRESSION: Nondisplaced acute fractures of the left superior and inferior pubic rami.  Original Report Authenticated By: Marlon Pel, M.D.   Ct Abdomen Pelvis W Contrast  08/03/2011  *RADIOLOGY REPORT*  Clinical Data: Buttock and groin pain after fall.  CT ABDOMEN AND PELVIS WITH CONTRAST  Technique:  Multidetector CT imaging of the abdomen and pelvis was performed following the standard protocol during bolus administration of intravenous contrast.  Contrast: OMNIPAQUE IOHEXOL 300 MG/ML  SOLN  Comparison: 03/06/2005  Findings: Calcified granuloma in the right lung base.  Small esophageal hiatal hernia.  Mild low attenuation change in the liver suggesting fatty infiltration.  Tiny low attenuation lesion in the inferior right lobe of the liver measuring about 4 mm diameter.  This is stable since the previous study and likely represents a small cyst or hemangioma.  Tiny additional low attenuation lesions in the liver and spleen likely represent small cysts and are stable as well. Gallbladder, pancreas, and adrenal glands are unremarkable. Multiple cysts in both kidneys.  Largest cyst in the mid/upper pole region of the right kidney measures  up to about 6 x 4 cm.  This is enlarging since the previous study.  There is suggestion of internal septation and possible enhancement.  Consider MRI for further evaluation to exclude underlying cystic renal cell carcinoma.  No hydronephrosis in either kidney.  Calcification of the abdominal aorta without aneurysm.  No retroperitoneal lymphadenopathy.  No free air or free fluid in the abdomen.  The gastric wall is not thickened.  Mild fluid-filled dilatation of the proximal jejunum with transition zone in the mid abdomen.  This could represent localized ileus, peristalsis, or early obstruction. Stool filled colon without distension.  Pelvis:  Acute fractures are  demonstrated of the left superior and inferior pubic rami with associated hematoma in the surrounding muscles.  Soft tissue contusion over the left hip.  The left hip is otherwise intact.  No bladder wall thickening.  No significant pelvic lymphadenopathy. No free or loculated pelvic fluid collections.  The uterus appears to be surgically absent.  No abnormal adnexal masses.  Appendix is surgically absent by history.  IMPRESSION:  1.  Fractures of the left superior and inferior pubic rami with associated hematomas. 2.  Enlarging cystic mass in the right kidney is indeterminate. Consider MRI for further evaluation. 3.  Nonspecific mild dilatation of the proximal jejunum may represent normal peristalsis versus localized ileus or early obstruction.  Consider follow-up if clinical symptoms are present. 4. Fatty infiltration of the liver.  Original Report Authenticated By: Marlon Pel, M.D.   Brief H and P: For complete details please refer to admission H and P, but in brief patient is 79 yo female with history of osteoporosis, hypertension, hyperlipidemia, peptic ulcer disease who presents to [facility] long emergency department after sustaining a fall at a friend's house. Patient reports she was at a friend's house having dinner, they have split level house, and as patient was walking around the table her foot caught on the edge of the table and she fell landing on her left side. She denies hitting her head or losing consciousness. Currently she reports having pain on the left side of the lower extremity extending to the left hip and left abdomen area. Patient reports she has history of chronic back pain but that has been relatively unchanged. Patient was brought to emergency department by emergency services. She currently denies chest pain, shortness of breath, no urinary concerns. Patient also denies fevers and chills, cough, recent sicknesses or hospitalization. We were asked to admit her for further  evaluation and management     Hospital Course:  Principal Problem:  *Bilateral pubic rami fractures Active Problems:  HYPOTHYROIDISM  HYPERLIPIDEMIA  Osteoporosis   #1 Left superior and inferior pubic rami fractures: weight-bearing as tolerated. To SNF today for rehab purposes. Will need to followup with ortho, Dr. Thomasena Edis, in 3 weeks in his office.  Rest of chronic medical issues have been stable this hospitalization.  Time spent on Discharge: Greater than 30 minutes  Signed: Chaya Jan [facility] Pager: [phone] 08/05/2011, 11:14 AM

## 2011-08-05 NOTE — Progress Notes (Signed)
Physical Therapy Treatment Patient Details Name: Julie Fritz MRN: [id number] DOB: [birth year] Today's Date: 08/05/2011 Time: 8295-6213 PT Time Calculation (min): 25 min  PT Assessment / Plan / Recommendation Comments on Treatment Session  Very unsteady, almost jumpy gross motor mvts with max c/o L hip "all over"pain.  Amb pt to and from the bathroom.    Follow Up Recommendations  Skilled nursing facility    Barriers to Discharge        Equipment Recommendations  Defer to next venue    Recommendations for Other Services    Frequency Min 5X/week   Plan Discharge plan remains appropriate    Precautions / Restrictions     Pertinent Vitals/Pain     Mobility  Bed Mobility Bed Mobility: Supine to Sit;Sitting - Scoot to Edge of Bed Supine to Sit: 2: Max assist Sitting - Scoot to [place] of Bed: 1: +2 Total assist Sitting - Scoot to [place] of Bed: Patient Percentage: 50% Details for Bed Mobility Assistance: increaased time and assistance 2nd increased c/o pain Transfers Transfers: Sit to Stand;Stand to Sit Sit to Stand: 1: +2 Total assist;From chair/3-in-1;From toilet Sit to Stand: Patient Percentage: 50% Stand to Sit: 1: +2 Total assist;To chair/3-in-1;To toilet Stand to Sit: Patient Percentage: 50% Details for Transfer Assistance: 50% VC's on hand placement and increased time Ambulation/Gait Ambulation/Gait Assistance: 1: +1 Total assist Ambulation/Gait: Patient Percentage: 60% Ambulation Distance (Feet): 24 Feet (12' x 2 to and from BR) Assistive device: Rolling walker Ambulation/Gait Assistance Details: increased time and 50% VC's on sequencing, upright posture and to decrease anxiety Gait Pattern: Step-to pattern;Decreased stance time - left;Narrow base of support;Antalgic Gait velocity: Very unsteady, almost jumpy gross motor mvts with max c/o L hip "all over"pain General Gait Details: pt c/o 6/10 pain, repositioned.  Max c/o L side swelling Stairs: No Wheelchair  Mobility Wheelchair Mobility: No     PT Goals Acute Rehab PT Goals PT Goal Formulation: With patient Potential to Achieve Goals: Good Pt will go Supine/Side to Sit: with modified independence PT Goal: Supine/Side to Sit - Progress: Progressing toward goal Pt will go Sit to Supine/Side: with modified independence PT Goal: Sit to Supine/Side - Progress: Progressing toward goal Pt will go Sit to Stand: with min assist PT Goal: Sit to Stand - Progress: Progressing toward goal Pt will go Stand to Sit: with min assist PT Goal: Stand to Sit - Progress: Progressing toward goal Pt will Transfer Bed to Chair/Chair to Bed: with min assist PT Transfer Goal: Bed to Chair/Chair to Bed - Progress: Progressing toward goal Pt will Ambulate: 51 - 150 feet;with min assist;with rolling walker PT Goal: Ambulate - Progress: Progressing toward goal  Visit Information  Last PT Received On: 08/05/11 Assistance Needed: +2    Subjective Data      Cognition    A x O x 4   Balance   poor  End of Session PT - End of Session Equipment Utilized During Treatment: Gait belt Activity Tolerance: Patient limited by pain Patient left: in chair;with call bell/phone within reach Nurse Communication: Mobility status;Other (comment) (Pt amb to and from BR)  Felecia Shelling  PTA [facility]  Acute  Rehab Pager     [phone]

## 2011-08-05 NOTE — Progress Notes (Signed)
Physical Therapy Treatment Patient Details Name: Julie Fritz MRN: [id number] DOB: 12-23-1930 Today's Date: 08/05/2011 Time: 0865-7846 PT Time Calculation (min): 40 min  PT Assessment / Plan / Recommendation Comments on Treatment Session  Pt fell at a friends house and sustained L superior and inferior pubic rami fx.  Pt plans to D/C to  SNF for rehab.  Assisted pt OOB to [facility] to void then amb.    Follow Up Recommendations  Skilled nursing facility            Equipment Recommendations  Defer to next venue       Frequency Min 5X/week   Plan Discharge plan remains appropriate    Precautions / Restrictions     Pertinent Vitals/Pain Pt c/o 6/10 L hip pain, premedicated and repositioned     Mobility  Bed Mobility Bed Mobility: Supine to Sit;Sitting - Scoot to Edge of Bed Supine to Sit: 2: Max assist Sitting - Scoot to [place] of Bed: 1: +2 Total assist Sitting - Scoot to [place] of Bed: Patient Percentage: 50% Details for Bed Mobility Assistance: increaased time and assistance 2nd increased c/o pain Transfers Transfers: Sit to Stand;Stand to Sit Sit to Stand: 1: +2 Total assist Sit to Stand: Patient Percentage: 50% Stand to Sit: 1: +2 Total assist Stand to Sit: Patient Percentage: 50% Details for Transfer Assistance: 50% VC's for hand placement and increased time.  Pt demonstartes increased anxiety with activity and very unsteady. Ambulation/Gait Ambulation/Gait Assistance: 1: +2 Total assist Ambulation/Gait: Patient Percentage: 50% Ambulation Distance (Feet): 18 Feet Assistive device: Rolling walker Ambulation/Gait Assistance Details: 50% VC's for sequencing and upright posture.  Tactile correction for posture to decrease hyper extension/hip thrust stance which caused pt to lean backward. Gait Pattern: Step-to pattern;Shuffle;Decreased stance time - left;Antalgic;Narrow base of support Gait velocity: Very unsteady, almost jumpy gross motor mvts with max c/o L hip "all  over"pain. Stairs: No Corporate treasurer: No        PT Goals Acute Rehab PT Goals PT Goal Formulation: With patient Potential to Achieve Goals: Good Pt will go Supine/Side to Sit: with modified independence PT Goal: Supine/Side to Sit - Progress: Progressing toward goal Pt will go Sit to Supine/Side: with modified independence PT Goal: Sit to Supine/Side - Progress: Progressing toward goal Pt will go Sit to Stand: with min assist PT Goal: Sit to Stand - Progress: Progressing toward goal Pt will go Stand to Sit: with min assist PT Goal: Stand to Sit - Progress: Progressing toward goal Pt will Transfer Bed to Chair/Chair to Bed: with min assist PT Transfer Goal: Bed to Chair/Chair to Bed - Progress: Progressing toward goal Pt will Ambulate: 51 - 150 feet;with min assist;with rolling walker PT Goal: Ambulate - Progress: Progressing toward goal  Visit Information  Last PT Received On: 08/05/11 Assistance Needed: +2    Subjective Data      Cognition   Good   Balance   poor  End of Session PT - End of Session Equipment Utilized During Treatment: Gait belt Activity Tolerance: Patient limited by pain Patient left: in chair;with call bell/phone within reach;with family/visitor present    Felecia Shelling  PTA [facility]  Acute  Rehab Pager     [phone]

## 2011-08-06 NOTE — Progress Notes (Signed)
Clinical Social Work Department CLINICAL SOCIAL WORK PLACEMENT NOTE 08/06/2011  Patient:  Julie Fritz, Julie Fritz  Account Number:  [id number] Admit date:  08/02/2011  Clinical Social Worker:  Cori Razor, LCSW  Date/time:  08/04/2011 03:36 PM  Clinical Social Work is seeking post-discharge placement for this patient at the following level of care:   SKILLED NURSING   (*CSW will update this form in Epic as items are completed)   08/04/2011  Patient/family provided with [facility] Department of Clinical Social Work's list of facilities offering this level of care within the geographic area requested by the patient (or if unable, by the patient's family).  08/04/2011  Patient/family informed of their freedom to choose among providers that offer the needed level of care, that participate in Medicare, Medicaid or managed care program needed by the patient, have an available bed and are willing to accept the patient.    Patient/family informed of [facility]' ownership interest in [facility], as well as of the fact that they are under no obligation to receive care at this facility.  PASARR submitted to EDS on 08/04/2011 PASARR number received from EDS on   FL2 transmitted to all facilities in geographic area requested by pt/family on  08/04/2011 FL2 transmitted to all facilities within larger geographic area on   Patient informed that his/her managed care company has contracts with or will negotiate with  certain facilities, including the following:     Patient/family informed of bed offers received:  08/04/2011 Patient chooses bed at [facility] Physician recommends and patient chooses bed at    Patient to be transferred to [facility] on  08/05/2011 Patient to be transferred to facility by   The following physician request were entered in Epic:   Additional Comments:  Cori Razor LCSW [phone]

## 2011-08-28 ENCOUNTER — Ambulatory Visit (INDEPENDENT_AMBULATORY_CARE_PROVIDER_SITE_OTHER): Payer: Medicare Other | Admitting: Internal Medicine

## 2011-08-28 ENCOUNTER — Other Ambulatory Visit (INDEPENDENT_AMBULATORY_CARE_PROVIDER_SITE_OTHER): Payer: Medicare Other

## 2011-08-28 ENCOUNTER — Telehealth: Payer: Self-pay | Admitting: Internal Medicine

## 2011-08-28 ENCOUNTER — Encounter: Payer: Self-pay | Admitting: Internal Medicine

## 2011-08-28 VITALS — BP 120/80 | HR 90 | Temp 97.3°F | Wt 133.4 lb

## 2011-08-28 DIAGNOSIS — F3289 Other specified depressive episodes: Secondary | ICD-10-CM

## 2011-08-28 DIAGNOSIS — M810 Age-related osteoporosis without current pathological fracture: Secondary | ICD-10-CM

## 2011-08-28 DIAGNOSIS — R300 Dysuria: Secondary | ICD-10-CM

## 2011-08-28 DIAGNOSIS — N281 Cyst of kidney, acquired: Secondary | ICD-10-CM

## 2011-08-28 DIAGNOSIS — Q619 Cystic kidney disease, unspecified: Secondary | ICD-10-CM

## 2011-08-28 DIAGNOSIS — F329 Major depressive disorder, single episode, unspecified: Secondary | ICD-10-CM

## 2011-08-28 LAB — URINALYSIS, ROUTINE W REFLEX MICROSCOPIC
Ketones, ur: NEGATIVE
Total Protein, Urine: NEGATIVE
pH: 5.5 (ref 5.0–8.0)

## 2011-08-28 MED ORDER — HYDROCODONE-ACETAMINOPHEN 5-325 MG PO TABS: 1.0000 | ORAL_TABLET | Freq: Four times a day (QID) | ORAL | Status: DC | PRN

## 2011-08-28 MED ORDER — CYCLOBENZAPRINE HCL 5 MG PO TABS: 5.0000 mg | ORAL_TABLET | Freq: Three times a day (TID) | ORAL | Status: DC | PRN

## 2011-08-28 MED ORDER — CEPHALEXIN 500 MG PO CAPS: 500.0000 mg | ORAL_CAPSULE | Freq: Four times a day (QID) | ORAL | Status: AC

## 2011-08-28 NOTE — Patient Instructions (Addendum)
Please start Oscal Plus D twice per day with meals Your last Bone Density was Nov 2011;  You would be due Dec 2013; please use the order today to have this done We will try to obtain your last result as well You will be contacted regarding the referral for: MRI for July 9 Continue all other medications as before - except to stop the pravastatin You should re-start the lipitor You are given the refills on the pain and muscle relaxer meds Please keep your appointments with your specialists as you have planned - ortho, July 8 Continue your PT as you are doing Please go to LAB in the Basement for the urine tests to be done today You will be contacted by phone if any changes need to be made immediately.  Otherwise, you will receive a letter about your results with an explanation.

## 2011-08-28 NOTE — Telephone Encounter (Signed)
UA today c/w prob uti -   antibx sent to pharmacy;  Urine cx pending  robiin to let pt know  Will try to let her know more after urine cx back in a few days

## 2011-08-28 NOTE — Telephone Encounter (Signed)
Called informed patient of results and antibiotic sent in.

## 2011-08-29 ENCOUNTER — Encounter: Payer: Self-pay | Admitting: Gynecology

## 2011-08-29 ENCOUNTER — Ambulatory Visit (INDEPENDENT_AMBULATORY_CARE_PROVIDER_SITE_OTHER): Payer: Medicare Other | Admitting: Gynecology

## 2011-08-29 ENCOUNTER — Telehealth: Payer: Self-pay

## 2011-08-29 DIAGNOSIS — N281 Cyst of kidney, acquired: Secondary | ICD-10-CM

## 2011-08-29 DIAGNOSIS — L293 Anogenital pruritus, unspecified: Secondary | ICD-10-CM

## 2011-08-29 DIAGNOSIS — M810 Age-related osteoporosis without current pathological fracture: Secondary | ICD-10-CM

## 2011-08-29 DIAGNOSIS — N898 Other specified noninflammatory disorders of vagina: Secondary | ICD-10-CM

## 2011-08-29 DIAGNOSIS — Q619 Cystic kidney disease, unspecified: Secondary | ICD-10-CM

## 2011-08-29 LAB — WET PREP FOR TRICH, YEAST, CLUE: Trich, Wet Prep: NONE SEEN

## 2011-08-29 LAB — URINE CULTURE: Colony Count: 40000

## 2011-08-29 MED ORDER — HYDROCODONE-ACETAMINOPHEN 5-325 MG PO TABS: 1.0000 | ORAL_TABLET | Freq: Four times a day (QID) | ORAL | Status: AC | PRN

## 2011-08-29 MED ORDER — FLUCONAZOLE 150 MG PO TABS: 150.0000 mg | ORAL_TABLET | Freq: Once | ORAL | Status: AC

## 2011-08-29 NOTE — Telephone Encounter (Signed)
The patient called and they have misplaced her printed rx of hydrocodone from appt. 08/28/11.  Stated they did not have it.  Please advise if ok to print and fax to pharmacy

## 2011-08-29 NOTE — Telephone Encounter (Signed)
Faxed hardcopy to pharmacy and informed patient of prescription sent in.

## 2011-08-29 NOTE — Patient Instructions (Signed)
Office will contact you to arrange urology follow up in reference to your kidney cyst. Office will contact you in reference to Prolia.

## 2011-08-29 NOTE — Progress Notes (Signed)
Patient presents having recently fallen leading to a pelvic fracture. She did not require surgery but was hospitalized and has been recently discharged to physical therapy. She did not fracture her hip but through her pelvic rami. She was found to have a UTI yesterday and begun on Keflex and now has some vaginal itching.  On review of her studies she has a right renal cyst that seems to be enlarging. I reviewed her prior CT studies from 2006 which showed the presence of this cyst it seems to be enlarging. This was the first time that I have reviewed the studies as these were ordered through other physicians.  Exam the skin chaperone present Abdomen soft nontender without masses guarding rebound organomegaly. Pelvis external BUS vagina with atrophic changes no significant discharge. Wet prep done. Bimanual without masses or tenderness.  Assessment and plan: 1. Wet prep unremarkable. We'll cover for yeast as it certainly sounds suspicious historically with Diflucan 150 mg. She'll take one now and then a second one at the end of her antibiotic course. I gave her one more to have available if she has a recurrence.  She will follow up if her symptoms persist or recur. 2. Right renal cyst. Patient relates that this has never been discussed with her by the other ordering physicians. I surely think this needs urology evaluation and will make that arrangement. 3. History of osteoporosis/recent fall with fracture. She had been on Fosamax in the past but was on a drug free holiday over the past year or so. I reviewed the situation with her and given her fracture that we should consider reinitiating treatment. Options to restart Fosamax versus trying something different such as Prolia were reviewed. She had been on Fosamax for several years preceding the fracture I think we'll try Prolia. I reviewed was involved with Prolia as far as the injections and the risks to include dermatitis/rash, increased risk of  infections, osteonecrosis of the jaw, atypical fractures. Patient understands and accepts these and we'll go ahead and see if we cannot pre-certify her for this.

## 2011-08-29 NOTE — Telephone Encounter (Signed)
Done hardcopy to robin  

## 2011-08-30 ENCOUNTER — Encounter: Payer: Self-pay | Admitting: Internal Medicine

## 2011-08-30 NOTE — Progress Notes (Signed)
Subjective:    Patient ID: Julie Fritz, female    DOB: 05-18-1930, 78 y.o.   MRN: [id number]  HPI  Here after fall with pelvic fracture, s/p admit/eval may 11-14, then post d/c rehab at [facility], then home in the last 3 days with PT ongoing.   Denies urinary symptoms such as frequency, urgency,or hematuria but has mild dysuria for several days.  Recent record review indicates enlarging renal cyst, pt desirous of MRI.  Does also have osteoporosis, last dxa nov 2011.  Also lipitor was changed to pravastatin while at rehab, pt confused by this.  Overall pain controlled but due for refill.  Pt denies chest pain, increased sob or doe, wheezing, orthopnea, PND, increased LE swelling, palpitations, dizziness or syncope.  Pt denies new neurological symptoms such as new headache, or facial or extremity weakness or numbness  Denies worsening depressive symptoms, suicidal ideation, or panic, though has ongoing anxiety. Past Medical History  Diagnosis Date  . FHx: migraine headaches   . ALLERGIC RHINITIS   . Hx of colonic polyps     hx of adenoma last 7/09  . Hyperlipidemia   . Osteoarthritis   . Peptic ulcer disease   . GERD (gastroesophageal reflux disease)   . Left knee DJD   . Right knee DJD   . Ileocecal valve syndrome   . IBS (irritable bowel syndrome)   . Anxiety   . Depression   . Lumbar spondylosis   . ALLERGIC RHINITIS 12/03/2006  . ANXIETY 05/31/2008  . CARPAL TUNNEL SYNDROME, BILATERAL 06/03/2007  . COLONIC POLYPS, HX OF 12/03/2006  . DEPRESSION 05/31/2008  . GERD 03/16/2007  . HYPERLIPIDEMIA 12/03/2006  . Irritable bowel syndrome 09/15/2007  . MIGRAINE HEADACHE 12/03/2006  . OSTEOARTHRITIS 12/03/2006  . PEPTIC ULCER DISEASE 12/03/2006  . Vitamin d deficiency 06/11/2010  . Cervical radicular pain 07/18/2010  . Hypertension   . HYPERTENSION 12/03/2006  . Hypothyroidism   . HYPOTHYROIDISM 12/03/2006  . Sinusitis   . OSTEOPOROSIS 11/2011t score -3.3  . Insomnia 04/23/2011   Past  Surgical History  Procedure Date  . Thyroidectomy     02/2004  . Tonsillectomy     1937  . Back surgery 1990    LUMBAR LAMINECTOMY  . Right hand surgery trigger surgury   . S/p right hand surgury/trigger surgury     Dr. Myerdierck  . Appendectomy     1979  . Abdominal hysterectomy     TAH  . Breast biopsy     1970    reports that she quit smoking about 7 years ago. Her smoking use included Cigarettes. She has quit using smokeless tobacco. She reports that she does not drink alcohol. Her drug history not on file. family history includes Alzheimer's disease in her mother; Dementia in her mother; Hypertension in her mother; and Leukemia in her sister. Allergies  Allergen Reactions  . Tetracycline     "Lightheaded."   Current Outpatient Prescriptions on File Prior to Visit  Medication Sig Dispense Refill  . atorvastatin (LIPITOR) 10 MG tablet TAKE 1 TABLET EVERY DAY  30 tablet  11  . fluticasone (FLONASE) 50 MCG/ACT nasal spray Place 2 sprays into the nose daily.  16 g  2  . lansoprazole (PREVACID) 30 MG capsule TAKE ONE CAPSULE BY MOUTH EVERY DAY  90 capsule  2  . levothyroxine (SYNTHROID) 75 MCG tablet Take 1 tablet (75 mcg total) by mouth daily.  90 tablet  3  . lisinopril (PRINIVIL,ZESTRIL) 5 MG tablet Take  1 tablet (5 mg total) by mouth daily.  90 tablet  3  . zolpidem (AMBIEN) 5 MG tablet Take 1 tablet (5 mg total) by mouth at bedtime as needed for sleep.  30 tablet  5   Review of Systems Review of Systems  Constitutional: Negative for diaphoresis and unexpected weight change.    Eyes: Negative for photophobia and visual disturbance.  Respiratory: Negative for cough.   Gastrointestinal: Negative for vomiting and blood in stool.  Genitourinary: Negative for hematuria and decreased urine volume.  Musculoskeletal: Negative for joint swelling Skin: Negative for wound.  Neurological: Negative for tremors and numbness.  Psychiatric/Behavioral: Negative for decreased  concentration. The patient is not hyperactive.      Objective:   Physical Exam BP 120/80  Pulse 90  Temp(Src) 97.3 F (36.3 C) (Oral)  Wt 133 lb 6 oz (60.499 kg)  SpO2 90% Physical Exam  VS noted Constitutional: Pt appears well-developed and well-nourished.  HENT: Head: Normocephalic.  Right Ear: External ear normal.  Left Ear: External ear normal.  Eyes: Conjunctivae and EOM are normal. Pupils are equal, round, and reactive to light.  Neck: Normal range of motion. Neck supple.  Cardiovascular: Normal rate and regular rhythm.   Pulmonary/Chest: Effort normal and breath sounds normal.  Abd:  Soft, NT, non-distended, + BS, no flank tender Neurological: Pt is alert. Not confused Skin: Skin is warm. No erythema.  Psychiatric: Pt behavior is normal. Thought content normal. 1+ nervous, not depressed affect    Assessment & Plan:

## 2011-08-30 NOTE — Assessment & Plan Note (Signed)
stable overall by hx and exam, , and pt to continue medical treatment as before   

## 2011-08-30 NOTE — Assessment & Plan Note (Signed)
May be approp for prolia, will need to obtain prior record, will need f/u dxa when approp

## 2011-08-30 NOTE — Assessment & Plan Note (Signed)
Mild to mod, for urine study and consider antibx,  to f/u any worsening symptoms or concerns

## 2011-08-30 NOTE — Assessment & Plan Note (Signed)
Unclear if bening, ok for MRI, pt reqeusts July 9

## 2011-09-01 ENCOUNTER — Telehealth: Payer: Self-pay | Admitting: *Deleted

## 2011-09-01 NOTE — Telephone Encounter (Signed)
Message copied by Libby Maw on Mon Sep 01, 2011  3:37 PM ------      Message from: Dara Lords      Created: Fri Aug 29, 2011 12:29 PM       Huan Pollok, 2 issues:      #1 urology appointment reference enlarging right renal cyst. Copies of her CT/MRI is from 2006, 2010 and 2013 is information these be forwarded. Husband sees Dr. Isabel Caprice if possible or Dr. Patsi Sears.      #2 ask Sherrilyn Rist or whoever taken precertified for Prolia. She has recent pelvic fracture and has been on Fosamax.

## 2011-09-01 NOTE — Telephone Encounter (Signed)
Lm for patient to call.  Appt set up with Dr. Isabel Caprice on 10/14/11 @ 2:30pm. Records faxed.  Information sent in to get benefits for Prolia.

## 2011-09-02 NOTE — Telephone Encounter (Signed)
Patient informed information

## 2011-09-04 ENCOUNTER — Telehealth: Payer: Self-pay

## 2011-09-04 NOTE — Telephone Encounter (Signed)
Nothing further needed on my part at this time as she is seeing ortho today

## 2011-09-04 NOTE — Telephone Encounter (Signed)
Julie Fritz with [facility] called to inform of visit today.  Patient is still in a lot of pain, not taking her flexeril, temp. 99.1, no BM in 5 days (she has not been taking her miralax, but did take one dose today).   The patient is seeing her ortho today.  Please advise.  Call back number is [phone]. The nurse went over thoroughly her med. List and how to take her medications.

## 2011-09-05 ENCOUNTER — Other Ambulatory Visit: Payer: Self-pay | Admitting: *Deleted

## 2011-09-05 DIAGNOSIS — M810 Age-related osteoporosis without current pathological fracture: Secondary | ICD-10-CM

## 2011-09-05 MED ORDER — DENOSUMAB 60 MG/ML ~~LOC~~ SOLN
60.0000 mg | Freq: Once | SUBCUTANEOUS | Status: AC
  Administered 2013-05-11: 60 mg via SUBCUTANEOUS

## 2011-09-08 ENCOUNTER — Ambulatory Visit: Payer: Medicare Other

## 2011-09-08 ENCOUNTER — Encounter: Payer: Self-pay | Admitting: Gynecology

## 2011-09-08 ENCOUNTER — Ambulatory Visit (INDEPENDENT_AMBULATORY_CARE_PROVIDER_SITE_OTHER): Payer: Medicare Other | Admitting: Gynecology

## 2011-09-08 DIAGNOSIS — R300 Dysuria: Secondary | ICD-10-CM

## 2011-09-08 DIAGNOSIS — M810 Age-related osteoporosis without current pathological fracture: Secondary | ICD-10-CM

## 2011-09-08 LAB — URINALYSIS W MICROSCOPIC + REFLEX CULTURE
Glucose, UA: NEGATIVE mg/dL
Nitrite: NEGATIVE
Protein, ur: NEGATIVE mg/dL
pH: 7 (ref 5.0–8.0)

## 2011-09-08 MED ORDER — DENOSUMAB 60 MG/ML ~~LOC~~ SOLN
60.0000 mg | Freq: Once | SUBCUTANEOUS | Status: AC
  Administered 2011-09-08: 60 mg via SUBCUTANEOUS

## 2011-09-08 NOTE — Progress Notes (Signed)
Patient presents for her Prolia but noted that she was having some mild dysuria. She was treated by Dr. Jonny Ruiz for UTI but admits to not taking all of her antibiotics. She initially got better but now has some mild dysuria.  Exam with Elane Fritz chaperone present Spine straight without CVA tenderness Abdomen soft nontender without masses guarding rebound organomegaly  Assessment and plan:  Urinalysis possible low-grade UTI. We'll follow up on culture and treat accordingly.

## 2011-09-08 NOTE — Patient Instructions (Signed)
Follow up for urine culture results.

## 2011-09-10 ENCOUNTER — Telehealth: Payer: Self-pay | Admitting: Gynecology

## 2011-09-10 LAB — URINE CULTURE: Colony Count: 15000

## 2011-09-10 MED ORDER — SULFAMETHOXAZOLE-TMP DS 800-160 MG PO TABS: 1.0000 | ORAL_TABLET | Freq: Two times a day (BID) | ORAL | Status: AC

## 2011-09-10 NOTE — Telephone Encounter (Signed)
Tell patient that urine did grow out a little bit of bacteria and I want to treat her with an antibiotic.  Septra DS 1 by mouth twice a day x3 days

## 2011-09-10 NOTE — Telephone Encounter (Signed)
Pt informed with the below note. 

## 2011-09-15 ENCOUNTER — Other Ambulatory Visit: Payer: Self-pay | Admitting: Specialist

## 2011-09-15 ENCOUNTER — Ambulatory Visit: Payer: Medicare Other | Admitting: Internal Medicine

## 2011-09-15 ENCOUNTER — Telehealth: Payer: Self-pay

## 2011-09-15 ENCOUNTER — Ambulatory Visit
Admission: RE | Admit: 2011-09-15 | Discharge: 2011-09-15 | Disposition: A | Discharge status: Home / Self Care | Payer: Medicare Other | Source: Ambulatory Visit | Attending: Specialist | Admitting: Specialist

## 2011-09-15 DIAGNOSIS — S32509A Unspecified fracture of unspecified pubis, initial encounter for closed fracture: Secondary | ICD-10-CM

## 2011-09-15 NOTE — Telephone Encounter (Signed)
The patients husband is requesting an MRI to be ordered for the patient.  The husband stated the patient is in increased pain now on her right side.  MD stated would need OV today if possible to evaluate before able to schedule MRI. Informed the the patients husband and he schedule OV today 09/15/11.

## 2011-09-19 ENCOUNTER — Telehealth: Payer: Self-pay

## 2011-09-19 ENCOUNTER — Ambulatory Visit (INDEPENDENT_AMBULATORY_CARE_PROVIDER_SITE_OTHER): Payer: Medicare Other | Admitting: Internal Medicine

## 2011-09-19 ENCOUNTER — Encounter: Payer: Self-pay | Admitting: Internal Medicine

## 2011-09-19 ENCOUNTER — Other Ambulatory Visit: Payer: Medicare Other

## 2011-09-19 VITALS — BP 106/72 | HR 94 | Temp 97.2°F | Wt 130.0 lb

## 2011-09-19 DIAGNOSIS — M810 Age-related osteoporosis without current pathological fracture: Secondary | ICD-10-CM

## 2011-09-19 DIAGNOSIS — S32591A Other specified fracture of right pubis, initial encounter for closed fracture: Secondary | ICD-10-CM

## 2011-09-19 DIAGNOSIS — E559 Vitamin D deficiency, unspecified: Secondary | ICD-10-CM

## 2011-09-19 DIAGNOSIS — S32509A Unspecified fracture of unspecified pubis, initial encounter for closed fracture: Secondary | ICD-10-CM

## 2011-09-19 DIAGNOSIS — F411 Generalized anxiety disorder: Secondary | ICD-10-CM

## 2011-09-19 MED ORDER — FENTANYL 25 MCG/HR TD PT72: 1.0000 | MEDICATED_PATCH | TRANSDERMAL | Status: DC

## 2011-09-19 NOTE — Telephone Encounter (Signed)
Request verbal authorization to extend OT 1/week for 2 weeks, toilet transfer training and meal preparation with wheelchair . Verbal authorization given per [facility] protocol.

## 2011-09-19 NOTE — Patient Instructions (Addendum)
Take all new medications as prescribed - the pain medication If it is not working well enough over the weekend, you can take 2 patches, but you must call and notify us that is done Please go to LAB in the Basement for the blood and/or urine tests to be done today - just the Vitamin D level today You will be contacted by phone if any changes need to be made immediately.  Otherwise, you will receive a letter about your results with an explanation. Continue all other medications as before, including your current calcium and vit D Please keep your appointments with your specialists as you have planned - physical therapy and orthopedic We will help schedule the Bone Density at [facility] for hopefully soon

## 2011-09-20 ENCOUNTER — Encounter: Payer: Self-pay | Admitting: Internal Medicine

## 2011-09-20 ENCOUNTER — Other Ambulatory Visit: Payer: Self-pay | Admitting: Internal Medicine

## 2011-09-20 NOTE — Progress Notes (Signed)
Subjective:    Patient ID: Julie Fritz, female    DOB: 1930/04/20, 78 y.o.   MRN: [id number]  HPI  Here to f/u with husband (both of whom not getting along well today with harsh words making history taking difficult);  Unfortunately pt  suffered new pelvic fx after initial episode, seen per ortho , did have first prolia shot for osteoporosis 2 wks ago with plan to f/u q 6 months;  Bone density with lowest t-score < -2.26 Jan 2010 per GYN but not actively treated at that time, now urged by ortho per pt to seek care here for f/u for further eval and tx including pain control and repeat bone density, for which she is adamant to have at this time despite known disease, recent fx and already treated.  Pt denies chest pain, increased sob or doe, wheezing, orthopnea, PND, increased LE swelling, palpitations, dizziness or syncope.  Pt denies new neurological symptoms such as new headache, or facial or extremity weakness or numbness.   Pt denies polydipsia, polyuria. Past Medical History  Diagnosis Date  . FHx: migraine headaches   . ALLERGIC RHINITIS   . Hx of colonic polyps     hx of adenoma last 7/09  . Hyperlipidemia   . Osteoarthritis   . Peptic ulcer disease   . GERD (gastroesophageal reflux disease)   . Left knee DJD   . Right knee DJD   . Ileocecal valve syndrome   . IBS (irritable bowel syndrome)   . Anxiety   . Depression   . Lumbar spondylosis   . ALLERGIC RHINITIS 12/03/2006  . ANXIETY 05/31/2008  . CARPAL TUNNEL SYNDROME, BILATERAL 06/03/2007  . COLONIC POLYPS, HX OF 12/03/2006  . DEPRESSION 05/31/2008  . GERD 03/16/2007  . HYPERLIPIDEMIA 12/03/2006  . Irritable bowel syndrome 09/15/2007  . MIGRAINE HEADACHE 12/03/2006  . OSTEOARTHRITIS 12/03/2006  . PEPTIC ULCER DISEASE 12/03/2006  . Vitamin d deficiency 06/11/2010  . Cervical radicular pain 07/18/2010  . Hypertension   . HYPERTENSION 12/03/2006  . Hypothyroidism   . HYPOTHYROIDISM 12/03/2006  . Sinusitis   . OSTEOPOROSIS 11/2011t  score -3.3  . Insomnia 04/23/2011   Past Surgical History  Procedure Date  . Thyroidectomy     02/2004  . Tonsillectomy     1937  . Back surgery 1990    LUMBAR LAMINECTOMY  . Right hand surgery trigger surgury   . S/p right hand surgury/trigger surgury     Dr. Myerdierck  . Appendectomy     1979  . Abdominal hysterectomy     TAH  . Breast biopsy     1970    reports that she quit smoking about 7 years ago. Her smoking use included Cigarettes. She has quit using smokeless tobacco. She reports that she does not drink alcohol. Her drug history not on file. family history includes Alzheimer's disease in her mother; Dementia in her mother; Hypertension in her mother; and Leukemia in her sister. Allergies  Allergen Reactions  . Tetracycline     "Lightheaded."   Current Outpatient Prescriptions on File Prior to Visit  Medication Sig Dispense Refill  . atorvastatin (LIPITOR) 10 MG tablet TAKE 1 TABLET EVERY DAY  30 tablet  11  . fluticasone (FLONASE) 50 MCG/ACT nasal spray Place 2 sprays into the nose daily.  16 g  2  . lansoprazole (PREVACID) 30 MG capsule TAKE ONE CAPSULE BY MOUTH EVERY DAY  90 capsule  2  . levothyroxine (SYNTHROID) 75 MCG tablet Take 1  tablet (75 mcg total) by mouth daily.  90 tablet  3  . lisinopril (PRINIVIL,ZESTRIL) 5 MG tablet Take 1 tablet (5 mg total) by mouth daily.  90 tablet  3  . sulfamethoxazole-trimethoprim (BACTRIM DS) 800-160 MG per tablet Take 1 tablet by mouth 2 (two) times daily.  6 tablet  0  . zolpidem (AMBIEN) 5 MG tablet Take 1 tablet (5 mg total) by mouth at bedtime as needed for sleep.  30 tablet  5   Current Facility-Administered Medications on File Prior to Visit  Medication Dose Route Frequency Provider Last Rate Last Dose  . denosumab (PROLIA) injection 60 mg  60 mg Subcutaneous Once Dara Lords, MD       Review of Systems Review of Systems  Constitutional: Negative for diaphoresis and unexpected weight change.   Eyes: Negative  for photophobia and visual disturbance.  Respiratory: Negative for choking and stridor.   Gastrointestinal: Negative for vomiting and blood in stool.  Genitourinary: Negative for hematuria and decreased urine volume.  Skin: Negative for color change and wound.  Neurological: Negative for tremors and numbness.  Psychiatric/Behavioral: Negative for decreased concentration. The patient is not hyperactive.      Objective:   Physical Exam BP 106/72  Pulse 94  Temp 97.2 F (36.2 C) (Oral)  Wt 130 lb (58.968 kg)  SpO2 97% Physical Exam  VS noted, in wheelchair Constitutional: Pt appears well-developed and well-nourished.  HENT: Head: Normocephalic.  Right Ear: External ear normal.  Left Ear: External ear normal.  Eyes: Conjunctivae and EOM are normal. Pupils are equal, round, and reactive to light.  Neck: Normal range of motion. Neck supple.  Cardiovascular: Normal rate and regular rhythm.   Pulmonary/Chest: Effort normal and breath sounds normal.  Abd:  Soft, NT, non-distended, + BS Neurological: Pt is alert. Not confused.  Skin: Skin is warm. No erythema.  Psychiatric: Thought content normal. 2-3+ nervous, aggrevated by husband's behavior today as well    Assessment & Plan:

## 2011-09-20 NOTE — Assessment & Plan Note (Signed)
situationally worse certainly due to pain, also frustrated it seems with husband apparent attitude of insensitivity to her plight, declines further tx at this time

## 2011-09-20 NOTE — Assessment & Plan Note (Signed)
Consider short term high dose replacement if low again, then higher dose long term tx after

## 2011-09-20 NOTE — Assessment & Plan Note (Signed)
With severe pain, for duragesic patch asd due to poor overall control with prn pain med

## 2011-09-20 NOTE — Assessment & Plan Note (Signed)
To Cont prolia, ok for repeat dxa as pt is demanding today, as well as repeat Vit D; disease most likely menopausal related, to maintain adequate thyroid replacement, and resume wt bearing exercise as able after fx healing, as well as routine calcium/vit D replacement; consider w/u for secondary causes

## 2011-09-21 ENCOUNTER — Other Ambulatory Visit: Payer: Self-pay | Admitting: Internal Medicine

## 2011-09-21 DIAGNOSIS — M810 Age-related osteoporosis without current pathological fracture: Secondary | ICD-10-CM

## 2011-09-21 MED ORDER — VITAMIN D3 1.25 MG (50000 UT) PO CAPS: 1.0000 | ORAL_CAPSULE | ORAL | Status: AC

## 2011-09-22 ENCOUNTER — Ambulatory Visit (INDEPENDENT_AMBULATORY_CARE_PROVIDER_SITE_OTHER)
Admission: RE | Admit: 2011-09-22 | Discharge: 2011-09-22 | Disposition: A | Discharge status: Home / Self Care | Payer: Medicare Other | Source: Ambulatory Visit

## 2011-09-22 ENCOUNTER — Other Ambulatory Visit: Payer: Medicare Other

## 2011-09-22 DIAGNOSIS — E039 Hypothyroidism, unspecified: Secondary | ICD-10-CM

## 2011-09-22 DIAGNOSIS — M810 Age-related osteoporosis without current pathological fracture: Secondary | ICD-10-CM

## 2011-09-23 LAB — PTH, INTACT AND CALCIUM
Calcium, Total (PTH): 9.2 mg/dL (ref 8.4–10.5)
PTH: 97.1 pg/mL — ABNORMAL HIGH (ref 14.0–72.0)

## 2011-09-24 ENCOUNTER — Other Ambulatory Visit: Payer: Self-pay | Admitting: Internal Medicine

## 2011-09-24 ENCOUNTER — Encounter: Payer: Self-pay | Admitting: Internal Medicine

## 2011-09-24 MED ORDER — CALCITRIOL 0.25 MCG PO CAPS
0.2500 ug | ORAL_CAPSULE | Freq: Every day | ORAL | Status: DC
Start: 2011-09-24 — End: 2011-10-01

## 2011-09-30 ENCOUNTER — Telehealth: Payer: Self-pay

## 2011-09-30 NOTE — Telephone Encounter (Signed)
Ok this time 

## 2011-09-30 NOTE — Telephone Encounter (Signed)
Julie Fritz left detailed message of MD's response

## 2011-09-30 NOTE — Telephone Encounter (Signed)
[facility] called to inform a drug interaction between fentanyl patch and Fluconazole (listed on the patients medication list).  Please advise.  Call back number is [phone]

## 2011-10-01 ENCOUNTER — Ambulatory Visit (INDEPENDENT_AMBULATORY_CARE_PROVIDER_SITE_OTHER)
Admission: RE | Admit: 2011-10-01 | Discharge: 2011-10-01 | Disposition: A | Discharge status: Home / Self Care | Payer: Medicare Other | Source: Ambulatory Visit | Attending: Internal Medicine | Admitting: Internal Medicine

## 2011-10-01 ENCOUNTER — Encounter: Payer: Self-pay | Admitting: Internal Medicine

## 2011-10-01 ENCOUNTER — Ambulatory Visit (INDEPENDENT_AMBULATORY_CARE_PROVIDER_SITE_OTHER): Payer: Medicare Other | Admitting: Internal Medicine

## 2011-10-01 VITALS — BP 110/68 | HR 75 | Temp 97.4°F | Wt 127.0 lb

## 2011-10-01 DIAGNOSIS — S32591A Other specified fracture of right pubis, initial encounter for closed fracture: Secondary | ICD-10-CM

## 2011-10-01 DIAGNOSIS — E559 Vitamin D deficiency, unspecified: Secondary | ICD-10-CM

## 2011-10-01 DIAGNOSIS — F329 Major depressive disorder, single episode, unspecified: Secondary | ICD-10-CM

## 2011-10-01 DIAGNOSIS — M810 Age-related osteoporosis without current pathological fracture: Secondary | ICD-10-CM

## 2011-10-01 DIAGNOSIS — E213 Hyperparathyroidism, unspecified: Secondary | ICD-10-CM

## 2011-10-01 DIAGNOSIS — S32509A Unspecified fracture of unspecified pubis, initial encounter for closed fracture: Secondary | ICD-10-CM

## 2011-10-01 MED ORDER — FENTANYL 25 MCG/HR TD PT72: 1.0000 | MEDICATED_PATCH | TRANSDERMAL | Status: DC

## 2011-10-01 MED ORDER — CALCITRIOL 0.25 MCG PO CAPS: 0.2500 ug | ORAL_CAPSULE | Freq: Every day | ORAL | Status: DC

## 2011-10-01 MED ORDER — CITALOPRAM HYDROBROMIDE 10 MG PO TABS: 10.0000 mg | ORAL_TABLET | Freq: Every day | ORAL | Status: DC

## 2011-10-01 NOTE — Patient Instructions (Addendum)
Please start the citalopram 10 mg per day for mood OK to take the hydrocodone 5/325 that you have at home at 1 pill very 4-6 hours only if needed (and take one about 45 min to 1 hour before trying to get out of bed in the morning) Please continue the fentanyl at 25 mcg (you are given the next prescription for the next month as well) Please continue the Vitamin D as you are doing Please start Oscal  - 1 pill three times per day with meals Please start the calcitriol (sent again to your pharmacy) We will need to let Ruby here at the office know about your Prolia, to keep it going every 6 months OK to take one other Multivitamin such as Centrum Silver - 1 per day (you should not need any other vitamins besides this) Please start miralax every day, as well as stool softner such as colace 100 mg twice per day as long as you take the fentanyl (unless you have diarrhea, then hold off) Please go to XRAY in the Basement for the x-ray test You will be contacted by phone if any changes need to be made immediately.  Otherwise, you will receive a letter about your results with an explanation. Please return in 3 weeks

## 2011-10-02 ENCOUNTER — Telehealth: Payer: Self-pay

## 2011-10-02 NOTE — Telephone Encounter (Signed)
Called Julie Fritz with [facility]osp[facility] informed of MD's instructions.  Julie Fritz with [facility]osp[facility] stated that when the patient called 2 days ago she was crying (she could hardly understand her).  The patient stated that her ortho. Informed her she had only 6 months to live.  Hospice will call the patient to inform there is to indication for hospice.

## 2011-10-02 NOTE — Telephone Encounter (Signed)
[facility] called to inform the patient called Hospice 2 days ago requesting their services for herself.   Hospice thinks the patient may not qualify for their care.  Please advise and if she does qualify would Dr. Jonny Ruiz be her attending physician.

## 2011-10-02 NOTE — Telephone Encounter (Signed)
No indication for Hospice at this time

## 2011-10-04 ENCOUNTER — Encounter: Payer: Self-pay | Admitting: Internal Medicine

## 2011-10-04 DIAGNOSIS — E213 Hyperparathyroidism, unspecified: Secondary | ICD-10-CM | POA: Insufficient documentation

## 2011-10-04 NOTE — Assessment & Plan Note (Signed)
For vit d replacement asd,  to f/u any worsening symptoms or concerns

## 2011-10-04 NOTE — Assessment & Plan Note (Addendum)
I think less likely to have further fx this time, but will defer to pt, for repaet films today, cont current pain med and specifically add the vicodin in the AM po x 1 at least 30 min prior to trying to get up in the AM; also for miralax prn constiopation likely related to less activity and narcotic

## 2011-10-04 NOTE — Assessment & Plan Note (Signed)
Ok for vit d 2000 units per day, caliucm 500 tid with meals, as well as centrum silver type MVI - 1 per day, cont prolia - will try to arrange further admin per pt reqeust

## 2011-10-04 NOTE — Progress Notes (Signed)
Subjective:    Patient ID: Julie Fritz, female    DOB: January 12, 1931, 78 y.o.   MRN: [id number]  HPI  Here to f/u with husband present, and the two are bickering quite a bit, he appears to have little patience for her emotionality, though he remains very supportive with all of her other needs, and supports her hx today;  Has had worsening situational depression and anxiety in the past 2-3 wks, did see ortho who has released her from ortho care but though she might benefit from tx for depression, as well as ongoing pain; pain specifially worse with first getting up in the AM.  She and he are worried about further spontaneous fx as pain ? Mild worse; fentanyl 25 working ok except for occasional flare such as first in the AM.  Has vicodin as well at home, not taking, very worried about becoming narcotic dependent.  Will also need ongoing prolia, has first injection June 2013.  Recent PTH level mild elevated as well, has not been taking the rocaltrol, due to some kind of communication problem.  Worried about taking too much Vit D as well, and wonder about taking calicum.  Also with mild constipation recently but very mild, no abd pain, n/v, fever.  No recent falls.   Past Medical History  Diagnosis Date  . FHx: migraine headaches   . ALLERGIC RHINITIS   . Hx of colonic polyps     hx of adenoma last 7/09  . Hyperlipidemia   . Osteoarthritis   . Peptic ulcer disease   . GERD (gastroesophageal reflux disease)   . Left knee DJD   . Right knee DJD   . Ileocecal valve syndrome   . IBS (irritable bowel syndrome)   . Anxiety   . Depression   . Lumbar spondylosis   . ALLERGIC RHINITIS 12/03/2006  . ANXIETY 05/31/2008  . CARPAL TUNNEL SYNDROME, BILATERAL 06/03/2007  . COLONIC POLYPS, HX OF 12/03/2006  . DEPRESSION 05/31/2008  . GERD 03/16/2007  . HYPERLIPIDEMIA 12/03/2006  . Irritable bowel syndrome 09/15/2007  . MIGRAINE HEADACHE 12/03/2006  . OSTEOARTHRITIS 12/03/2006  . PEPTIC ULCER DISEASE 12/03/2006    . Vitamin d deficiency 06/11/2010  . Cervical radicular pain 07/18/2010  . Hypertension   . HYPERTENSION 12/03/2006  . Hypothyroidism   . HYPOTHYROIDISM 12/03/2006  . Sinusitis   . OSTEOPOROSIS 11/2011t score -3.3  . Insomnia 04/23/2011   Past Surgical History  Procedure Date  . Thyroidectomy     02/2004  . Tonsillectomy     1937  . Back surgery 1990    LUMBAR LAMINECTOMY  . Right hand surgery trigger surgury   . S/p right hand surgury/trigger surgury     Dr. Myerdierck  . Appendectomy     1979  . Abdominal hysterectomy     TAH  . Breast biopsy     1970    reports that she quit smoking about 7 years ago. Her smoking use included Cigarettes. She has quit using smokeless tobacco. She reports that she does not drink alcohol. Her drug history not on file. family history includes Alzheimer's disease in her mother; Dementia in her mother; Hypertension in her mother; and Leukemia in her sister. Allergies  Allergen Reactions  . Tetracycline     "Lightheaded."   Current Outpatient Prescriptions on File Prior to Visit  Medication Sig Dispense Refill  . atorvastatin (LIPITOR) 10 MG tablet TAKE 1 TABLET EVERY DAY  30 tablet  11  . Cholecalciferol (VITAMIN D3)  50000 UNITS CAPS Take 1 capsule by mouth once a week. For 2 months  8 capsule  0  . fluticasone (FLONASE) 50 MCG/ACT nasal spray Place 2 sprays into the nose daily.  16 g  2  . lansoprazole (PREVACID) 30 MG capsule TAKE ONE CAPSULE BY MOUTH EVERY DAY  90 capsule  2  . levothyroxine (SYNTHROID) 75 MCG tablet Take 1 tablet (75 mcg total) by mouth daily.  90 tablet  3  . lisinopril (PRINIVIL,ZESTRIL) 5 MG tablet Take 1 tablet (5 mg total) by mouth daily.  90 tablet  3  . zolpidem (AMBIEN) 5 MG tablet Take 1 tablet (5 mg total) by mouth at bedtime as needed for sleep.  30 tablet  5  . citalopram (CELEXA) 10 MG tablet Take 1 tablet (10 mg total) by mouth daily.  90 tablet  3   Current Facility-Administered Medications on File Prior to  Visit  Medication Dose Route Frequency Provider Last Rate Last Dose  . denosumab (PROLIA) injection 60 mg  60 mg Subcutaneous Once Dara Lords, MD       Review of Systems Review of Systems  Constitutional: Negative for diaphoresis and unexpected weight change.  Eyes: Negative for photophobia and visual disturbance.  Respiratory: Negative for choking and stridor.   Gastrointestinal: Negative for vomiting and blood in stool.  Genitourinary: Negative for hematuria and decreased urine volume.  Skin: Negative for color change and wound.  Neurological: Negative for tremors and numbness.     Objective:   Physical Exam BP 110/68  Pulse 75  Temp 97.4 F (36.3 C) (Oral)  Wt 127 lb (57.607 kg)  SpO2 95% Physical Exam  VS noted Constitutional: Pt appears well-developed and well-nourished.  HENT: Head: Normocephalic.  Right Ear: External ear normal.  Left Ear: External ear normal.  Eyes: Conjunctivae and EOM are normal. Pupils are equal, round, and reactive to light.  Neck: Normal range of motion. Neck supple.  Cardiovascular: Normal rate and regular rhythm.   Pulmonary/Chest: Effort normal and breath sounds normal.  Neurological: Pt is alert.  Skin: Skin is warm. No erythema.  Psychiatric: 2+ nervous, depressed affect    Assessment & Plan:

## 2011-10-04 NOTE — Assessment & Plan Note (Signed)
Mild, for rocaltrol .25 qd, will plan on f/u PTH level in about 3 mo or thereafter
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
## 2011-10-06 ENCOUNTER — Other Ambulatory Visit: Payer: Self-pay | Admitting: Internal Medicine

## 2011-10-06 MED ORDER — FENTANYL 25 MCG/HR TD PT72: 1.0000 | MEDICATED_PATCH | TRANSDERMAL | Status: AC
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
## 2011-10-15 ENCOUNTER — Other Ambulatory Visit (HOSPITAL_COMMUNITY): Payer: Self-pay | Admitting: Urology

## 2011-10-15 DIAGNOSIS — N281 Cyst of kidney, acquired: Secondary | ICD-10-CM

## 2011-10-20 ENCOUNTER — Ambulatory Visit (HOSPITAL_COMMUNITY)
Admission: RE | Admit: 2011-10-20 | Discharge: 2011-10-20 | Disposition: A | Discharge status: Home / Self Care | Payer: Medicare Other | Source: Ambulatory Visit | Attending: Urology | Admitting: Urology

## 2011-10-20 DIAGNOSIS — K7689 Other specified diseases of liver: Secondary | ICD-10-CM | POA: Insufficient documentation

## 2011-10-20 DIAGNOSIS — R109 Unspecified abdominal pain: Secondary | ICD-10-CM | POA: Insufficient documentation

## 2011-10-20 DIAGNOSIS — N281 Cyst of kidney, acquired: Secondary | ICD-10-CM

## 2011-10-20 LAB — CREATININE, SERUM: Creatinine, Ser: 1.03 mg/dL (ref 0.50–1.10)

## 2011-10-20 MED ORDER — GADOBENATE DIMEGLUMINE 529 MG/ML IV SOLN
11.0000 mL | Freq: Once | INTRAVENOUS | Status: AC | PRN
  Administered 2011-10-20: 11 mL via INTRAVENOUS

## 2011-10-27 ENCOUNTER — Ambulatory Visit (INDEPENDENT_AMBULATORY_CARE_PROVIDER_SITE_OTHER): Payer: Medicare Other | Admitting: Internal Medicine

## 2011-10-27 ENCOUNTER — Encounter: Payer: Self-pay | Admitting: Internal Medicine

## 2011-10-27 VITALS — BP 112/80 | HR 124 | Temp 97.0°F | Ht 59.0 in | Wt 125.2 lb

## 2011-10-27 DIAGNOSIS — S32591A Other specified fracture of right pubis, initial encounter for closed fracture: Secondary | ICD-10-CM

## 2011-10-27 DIAGNOSIS — M5430 Sciatica, unspecified side: Secondary | ICD-10-CM

## 2011-10-27 DIAGNOSIS — F411 Generalized anxiety disorder: Secondary | ICD-10-CM

## 2011-10-27 DIAGNOSIS — M5431 Sciatica, right side: Secondary | ICD-10-CM

## 2011-10-27 DIAGNOSIS — S32509A Unspecified fracture of unspecified pubis, initial encounter for closed fracture: Secondary | ICD-10-CM

## 2011-10-27 MED ORDER — PREDNISONE (PAK) 10 MG PO TABS: 10.0000 mg | ORAL_TABLET | Freq: Every day | ORAL | Status: AC

## 2011-10-27 NOTE — Assessment & Plan Note (Signed)
New onset since last visit, no new neuro chagnes, for predpack asd, cont pain control, declines surgury eval at this time

## 2011-10-27 NOTE — Assessment & Plan Note (Signed)
Imrpoved,  Cont the citalopram

## 2011-10-27 NOTE — Progress Notes (Signed)
Subjective:    Patient ID: Julie Fritz, female    DOB: 1931/03/05, 78 y.o.   MRN: [id number]  HPI  Here to f/u; wants to start PT soon, xray last visit neg for new fx; still doing 20% walker/80% wheelchair as far as activity, pain overall better controlled on current meds,  Much less nervous and depressed at this time by at least 50% but still numerous questions today; is able to stand and walk without walker, and get up on exam table herself; Pt denies chest pain, increased sob or doe, wheezing, orthopnea, PND, increased LE swelling, palpitations, dizziness or syncope.  Pt denies new neurological symptoms such as new headache, or facial or extremity weakness or numbness, but does incidnently have Pt continues to have new mild LBP without change in severity, intermittent since soon after last visit, worse to stand, radiates to the right calf with some pins and needles to the foot, but no bowel or bladder change, fever, wt loss,  or falls.  Has known hx of LS spine dz and prior lumbar surgury.  Just filled recent fentanyl patch rx Past Medical History  Diagnosis Date  . FHx: migraine headaches   . ALLERGIC RHINITIS   . Hx of colonic polyps     hx of adenoma last 7/09  . Hyperlipidemia   . Osteoarthritis   . Peptic ulcer disease   . GERD (gastroesophageal reflux disease)   . Left knee DJD   . Right knee DJD   . Ileocecal valve syndrome   . IBS (irritable bowel syndrome)   . Anxiety   . Depression   . Lumbar spondylosis   . ALLERGIC RHINITIS 12/03/2006  . ANXIETY 05/31/2008  . CARPAL TUNNEL SYNDROME, BILATERAL 06/03/2007  . COLONIC POLYPS, HX OF 12/03/2006  . DEPRESSION 05/31/2008  . GERD 03/16/2007  . HYPERLIPIDEMIA 12/03/2006  . Irritable bowel syndrome 09/15/2007  . MIGRAINE HEADACHE 12/03/2006  . OSTEOARTHRITIS 12/03/2006  . PEPTIC ULCER DISEASE 12/03/2006  . Vitamin d deficiency 06/11/2010  . Cervical radicular pain 07/18/2010  . Hypertension   . HYPERTENSION 12/03/2006  .  Hypothyroidism   . HYPOTHYROIDISM 12/03/2006  . Sinusitis   . OSTEOPOROSIS 11/2011t score -3.3  . Insomnia 04/23/2011   Past Surgical History  Procedure Date  . Thyroidectomy     02/2004  . Tonsillectomy     1937  . Back surgery 1990    LUMBAR LAMINECTOMY  . Right hand surgery trigger surgury   . S/p right hand surgury/trigger surgury     Dr. Myerdierck  . Appendectomy     1979  . Abdominal hysterectomy     TAH  . Breast biopsy     1970    reports that she quit smoking about 7 years ago. Her smoking use included Cigarettes. She has quit using smokeless tobacco. She reports that she does not drink alcohol. Her drug history not on file. family history includes Alzheimer's disease in her mother; Dementia in her mother; Hypertension in her mother; and Leukemia in her sister. Allergies  Allergen Reactions  . Tetracycline     "Lightheaded."   Current Outpatient Prescriptions on File Prior to Visit  Medication Sig Dispense Refill  . atorvastatin (LIPITOR) 10 MG tablet TAKE 1 TABLET EVERY DAY  30 tablet  11  . calcitRIOL (ROCALTROL) 0.25 MCG capsule Take 1 capsule (0.25 mcg total) by mouth daily.  90 capsule  3  . Cholecalciferol (VITAMIN D3) 50000 UNITS CAPS Take 1 capsule by mouth once a  week. For 2 months  8 capsule  0  . citalopram (CELEXA) 10 MG tablet Take 1 tablet (10 mg total) by mouth daily.  90 tablet  3  . fentaNYL (DURAGESIC - DOSED MCG/HR) 25 MCG/HR Place 1 patch (25 mcg total) onto the skin every 3 (three) days. To fill October 18, 2011  10 patch  0  . fluticasone (FLONASE) 50 MCG/ACT nasal spray Place 2 sprays into the nose daily.  16 g  2  . lansoprazole (PREVACID) 30 MG capsule TAKE ONE CAPSULE BY MOUTH EVERY DAY  90 capsule  2  . levothyroxine (SYNTHROID) 75 MCG tablet Take 1 tablet (75 mcg total) by mouth daily.  90 tablet  3  . lisinopril (PRINIVIL,ZESTRIL) 5 MG tablet Take 1 tablet (5 mg total) by mouth daily.  90 tablet  3  . zolpidem (AMBIEN) 5 MG tablet Take 1  tablet (5 mg total) by mouth at bedtime as needed for sleep.  30 tablet  5   Current Facility-Administered Medications on File Prior to Visit  Medication Dose Route Frequency Provider Last Rate Last Dose  . denosumab (PROLIA) injection 60 mg  60 mg Subcutaneous Once Dara Lords, MD       Review of Systems Review of Systems  Constitutional: Negative for diaphoresis and unexpected weight change.  HENT: Negative for drooling and tinnitus.   Eyes: Negative for photophobia and visual disturbance.  Respiratory: Negative for choking and stridor.   Gastrointestinal: Negative for vomiting and blood in stool.  Genitourinary: Negative for hematuria and decreased urine volume.  Musculoskeletal: Negative for acute joint sweling Skin: Negative for color change and wound.  Neurological: Negative for tremors    Objective:   Physical Exam BP 112/80  Pulse 124  Temp 97 F (36.1 C) (Oral)  Ht 4\' 11"  (1.499 m)  Wt 125 lb 4 oz (56.813 kg)  BMI 25.30 kg/m2  SpO2 93% Physical Exam  VS noted, much less nervous and agitated today Constitutional: Pt appears well-developed and well-nourished.  HENT: Head: Normocephalic.  Right Ear: External ear normal.  Left Ear: External ear normal.  Eyes: Conjunctivae and EOM are normal. Pupils are equal, round, and reactive to light.  Neck: Normal range of motion. Neck supple.  Cardiovascular: Normal rate and regular rhythm.   Pulmonary/Chest: Effort normal and breath sounds normal.  Abd:  Soft, NT, non-distended, + BS Neurological: Pt is alert. No cranial nerve deficit. motor/sens/dtr intact except has prob chronic mild distal LLE weakness and somewhat diminished left patellar reflex Skin: Skin is warm. No erythema.  Psychiatric: Pt behavior is normal. Thought content normal. 1+ nervous, has some difficutly with hx taking with concentration and memory/nervous     Assessment & Plan:

## 2011-10-27 NOTE — Assessment & Plan Note (Signed)
Overall improved, no need further xrays, cont pain control and wheelchair for now, for PT outpt,  Will hold on further fenatyl rx after finishes current rx

## 2011-10-27 NOTE — Patient Instructions (Signed)
Take all new medications as prescribed - the prednisone Continue all other medications as before You will be contacted regarding the referral for: Physical therapy (outpt) OK to keep the wheelchair for now Please stop the fentanyl patch after the current prescription is done

## 2011-10-29 ENCOUNTER — Telehealth: Payer: Self-pay

## 2011-10-29 NOTE — Telephone Encounter (Signed)
Patient is requesting her PT referral to go to [facility] as no cost.  The PT referral ordered previously was going to cost $35 per hr. And they cannot afford.   Please refer to [facility] as had once before and no cost.

## 2011-10-29 NOTE — Telephone Encounter (Signed)
Patients husband informed referral done.

## 2011-10-29 NOTE — Telephone Encounter (Signed)
Ok will forward to [facility] ?

## 2011-11-04 ENCOUNTER — Telehealth: Payer: Self-pay | Admitting: Internal Medicine

## 2011-11-04 DIAGNOSIS — R269 Unspecified abnormalities of gait and mobility: Secondary | ICD-10-CM

## 2011-11-04 DIAGNOSIS — S329XXA Fracture of unspecified parts of lumbosacral spine and pelvis, initial encounter for closed fracture: Secondary | ICD-10-CM

## 2011-11-04 NOTE — Telephone Encounter (Signed)
Pt phoning to say that [facility] is telling her they have not received the PT referral. RN checked notes and saw that a PT referral was completed on 10/29/2011. Pt insistent nurse send message to office to please send again or follow through.

## 2011-11-05 NOTE — Telephone Encounter (Signed)
Not sure why the confusion, I did repeat order for Home PT per [facility], hope this helps

## 2011-11-05 NOTE — Telephone Encounter (Signed)
Patient informed Home Health ordered.

## 2011-11-17 ENCOUNTER — Encounter: Payer: Self-pay | Admitting: Internal Medicine

## 2011-11-17 ENCOUNTER — Ambulatory Visit (INDEPENDENT_AMBULATORY_CARE_PROVIDER_SITE_OTHER): Payer: Medicare Other | Admitting: Internal Medicine

## 2011-11-17 ENCOUNTER — Telehealth: Payer: Self-pay

## 2011-11-17 VITALS — BP 110/70 | HR 63 | Temp 96.9°F | Ht 59.0 in | Wt 127.2 lb

## 2011-11-17 DIAGNOSIS — S32509A Unspecified fracture of unspecified pubis, initial encounter for closed fracture: Secondary | ICD-10-CM

## 2011-11-17 DIAGNOSIS — S32592A Other specified fracture of left pubis, initial encounter for closed fracture: Secondary | ICD-10-CM

## 2011-11-17 DIAGNOSIS — R3915 Urgency of urination: Secondary | ICD-10-CM

## 2011-11-17 DIAGNOSIS — E559 Vitamin D deficiency, unspecified: Secondary | ICD-10-CM

## 2011-11-17 DIAGNOSIS — S32591A Other specified fracture of right pubis, initial encounter for closed fracture: Secondary | ICD-10-CM

## 2011-11-17 DIAGNOSIS — Q619 Cystic kidney disease, unspecified: Secondary | ICD-10-CM

## 2011-11-17 DIAGNOSIS — R259 Unspecified abnormal involuntary movements: Secondary | ICD-10-CM

## 2011-11-17 DIAGNOSIS — R251 Tremor, unspecified: Secondary | ICD-10-CM

## 2011-11-17 DIAGNOSIS — F411 Generalized anxiety disorder: Secondary | ICD-10-CM

## 2011-11-17 DIAGNOSIS — M7072 Other bursitis of hip, left hip: Secondary | ICD-10-CM

## 2011-11-17 DIAGNOSIS — M76899 Other specified enthesopathies of unspecified lower limb, excluding foot: Secondary | ICD-10-CM

## 2011-11-17 DIAGNOSIS — N281 Cyst of kidney, acquired: Secondary | ICD-10-CM

## 2011-11-17 MED ORDER — TRAMADOL HCL 50 MG PO TABS: 50.0000 mg | ORAL_TABLET | Freq: Three times a day (TID) | ORAL | Status: AC | PRN

## 2011-11-17 NOTE — Assessment & Plan Note (Signed)
Ok to change to vit d 1000 units per day

## 2011-11-17 NOTE — Progress Notes (Signed)
Subjective:    Patient ID: Julie Fritz, female    DOB: Apr 28, 1930, 78 y.o.   MRN: [id number]  HPI Here to f/u with husband, overall pain now mild at best, walked with walker here today but appaers about ready to walk without this; able to stand readily and ascend exam table albeit slowly;  Has seen urology with renal cyst, benign, no further f/u per pt report.  Has ongoing left > right hand tremors, mild, declines tx today, was ongoing even before the fall may 2011 with the pelvic fx.  PT still ongoing.  Right sciatica symptoms from last visit resolved.  About to finish Vit D high dose, ready to change to ongoing daily low dose regimen.  Denies worsening depressive symptoms, suicidal ideation, or panic, though has ongoing anxiety, with some improvement since last visit.  Last fentanyl patch taken off this am.  Does have signficant new tenderness to left lateral hip area, mild but worse to lie on left side, not much worse with ambulation.  Also has some right plantar fasciiits symptoms recurring to right foot now that she is walking more.  Pt denies chest pain, increased sob or doe, wheezing, orthopnea, PND, increased LE swelling, palpitations, dizziness or syncope.  Pt denies new neurological symptoms such as new headache, or facial or extremity weakness or numbness.  Has some urinary urgency in the past wk, asks for UA., but  Denies urinary symptoms such as dysuria, frequency, or hematuria. Past Medical History  Diagnosis Date  . FHx: migraine headaches   . ALLERGIC RHINITIS   . Hx of colonic polyps     hx of adenoma last 7/09  . Hyperlipidemia   . Osteoarthritis   . Peptic ulcer disease   . GERD (gastroesophageal reflux disease)   . Left knee DJD   . Right knee DJD   . Ileocecal valve syndrome   . IBS (irritable bowel syndrome)   . Anxiety   . Depression   . Lumbar spondylosis   . ALLERGIC RHINITIS 12/03/2006  . ANXIETY 05/31/2008  . CARPAL TUNNEL SYNDROME, BILATERAL 06/03/2007  . COLONIC  POLYPS, HX OF 12/03/2006  . DEPRESSION 05/31/2008  . GERD 03/16/2007  . HYPERLIPIDEMIA 12/03/2006  . Irritable bowel syndrome 09/15/2007  . MIGRAINE HEADACHE 12/03/2006  . OSTEOARTHRITIS 12/03/2006  . PEPTIC ULCER DISEASE 12/03/2006  . Vitamin d deficiency 06/11/2010  . Cervical radicular pain 07/18/2010  . Hypertension   . HYPERTENSION 12/03/2006  . Hypothyroidism   . HYPOTHYROIDISM 12/03/2006  . Sinusitis   . OSTEOPOROSIS 11/2011t score -3.3  . Insomnia 04/23/2011   Past Surgical History  Procedure Date  . Thyroidectomy     02/2004  . Tonsillectomy     1937  . Back surgery 1990    LUMBAR LAMINECTOMY  . Right hand surgery trigger surgury   . S/p right hand surgury/trigger surgury     Dr. Myerdierck  . Appendectomy     1979  . Abdominal hysterectomy     TAH  . Breast biopsy     1970    reports that she quit smoking about 7 years ago. Her smoking use included Cigarettes. She has quit using smokeless tobacco. She reports that she does not drink alcohol. Her drug history not on file. family history includes Alzheimer's disease in her mother; Dementia in her mother; Hypertension in her mother; and Leukemia in her sister. Allergies  Allergen Reactions  . Tetracycline     "Lightheaded."   Current Outpatient Prescriptions on File  Prior to Visit  Medication Sig Dispense Refill  . atorvastatin (LIPITOR) 10 MG tablet TAKE 1 TABLET EVERY DAY  30 tablet  11  . calcitRIOL (ROCALTROL) 0.25 MCG capsule Take 1 capsule (0.25 mcg total) by mouth daily.  90 capsule  3  . citalopram (CELEXA) 10 MG tablet Take 1 tablet (10 mg total) by mouth daily.  90 tablet  3  . lansoprazole (PREVACID) 30 MG capsule TAKE ONE CAPSULE BY MOUTH EVERY DAY  90 capsule  2  . levothyroxine (SYNTHROID) 75 MCG tablet Take 1 tablet (75 mcg total) by mouth daily.  90 tablet  3  . lisinopril (PRINIVIL,ZESTRIL) 5 MG tablet Take 1 tablet (5 mg total) by mouth daily.  90 tablet  3  . zolpidem (AMBIEN) 5 MG tablet Take 1 tablet  (5 mg total) by mouth at bedtime as needed for sleep.  30 tablet  5  . Cholecalciferol (VITAMIN D3) 50000 UNITS CAPS Take 1 capsule by mouth once a week. For 2 months  8 capsule  0  . fluticasone (FLONASE) 50 MCG/ACT nasal spray Place 2 sprays into the nose daily.  16 g  2   Current Facility-Administered Medications on File Prior to Visit  Medication Dose Route Frequency Provider Last Rate Last Dose  . denosumab (PROLIA) injection 60 mg  60 mg Subcutaneous Once Dara Lords, MD       Review of Systems Review of Systems  Constitutional: Negative for diaphoresis and unexpected weight change.  HENT: Negative for drooling and tinnitus.   Eyes: Negative for photophobia and visual disturbance.  Respiratory: Negative for choking and stridor.   Gastrointestinal: Negative for vomiting and blood in stool.  Genitourinary: Negative for hematuria and decreased urine volume.  Musculoskeletal: Negative for acute joint swelling Skin: Negative for color change and wound.  Neurological: Neg for new weakness.  Psychiatric/Behavioral: Negative for decreased concentration. The patient is not hyperactive.      Objective:   Physical Exam BP 110/70  Pulse 63  Temp 96.9 F (36.1 C) (Oral)  Ht 4\' 11"  (1.499 m)  Wt 127 lb 4 oz (57.72 kg)  BMI 25.70 kg/m2  SpO2 94% Physical Exam  VS noted, not ill appearing, brighter, calmer - less nervous Constitutional: Pt appears well-developed and well-nourished.  HENT: Head: Normocephalic.  Right Ear: External ear normal.  Left Ear: External ear normal.  Eyes: Conjunctivae and EOM are normal. Pupils are equal, round, and reactive to light.  Neck: Normal range of motion. Neck supple.  Cardiovascular: Normal rate and regular rhythm.   Pulmonary/Chest: Effort normal and breath sounds normal.  Abd:  Soft, NT, non-distended, + BS Neurological: Pt is alert. Not confused, has fast tremor noted left > right hands Skin: Skin is warm. No erythema. No rash Left  lateral hip with tender over greater trochanter Psychiatric: Pt behavior is normal. Thought content normal. mild nervous, not depressed affect    Assessment & Plan:

## 2011-11-17 NOTE — Assessment & Plan Note (Signed)
C/w essential tremor,  to f/u any worsening symptoms or concerns, mild , does not need further tx at this time

## 2011-11-17 NOTE — Assessment & Plan Note (Signed)
Improved, to cont the celexa ,  to f/u any worsening symptoms or concerns

## 2011-11-17 NOTE — Assessment & Plan Note (Signed)
Improved, cont PT, ok to stop the fentanyl patch, for tramadol prn for pain as patch wears off

## 2011-11-17 NOTE — Assessment & Plan Note (Signed)
Benign per pt report, no further f/u planned per urology

## 2011-11-17 NOTE — Assessment & Plan Note (Signed)
?   clincial significance - for UA

## 2011-11-17 NOTE — Patient Instructions (Addendum)
Take all new medications as prescribed - the tramadol as needed for pain OK to stop the fentanyl patch Please continue the Physical Therapy, and the walker until the PT says you can stop OK to change to Vitamin D at 1000 units per day Please call if you would like the orthopedic for the left hip bursitis, or podiatry for the right foot Please return in 3 months, or sooner if needed

## 2011-11-17 NOTE — Assessment & Plan Note (Signed)
Mild, declines ortho referral, to avoid sleeping on left side

## 2011-11-17 NOTE — Telephone Encounter (Signed)
Patient informed. 

## 2011-11-17 NOTE — Telephone Encounter (Signed)
Message copied by Pincus Sanes on Mon Nov 17, 2011 11:59 AM ------      Message from: Corwin Levins      Created: Mon Nov 17, 2011 11:09 AM       yes      ----- Message -----         From: Vladimir Crofts Ewing         Sent: 11/17/2011  10:22 AM           To: Corwin Levins, MD            The patient would like to know if she needs to continue taking celexa.

## 2011-11-25 ENCOUNTER — Encounter: Payer: Self-pay | Admitting: Internal Medicine

## 2011-11-25 ENCOUNTER — Telehealth: Payer: Self-pay

## 2011-11-25 ENCOUNTER — Ambulatory Visit (INDEPENDENT_AMBULATORY_CARE_PROVIDER_SITE_OTHER)
Admission: RE | Admit: 2011-11-25 | Discharge: 2011-11-25 | Disposition: A | Discharge status: Home / Self Care | Payer: Medicare Other | Source: Ambulatory Visit | Attending: Internal Medicine | Admitting: Internal Medicine

## 2011-11-25 ENCOUNTER — Ambulatory Visit (INDEPENDENT_AMBULATORY_CARE_PROVIDER_SITE_OTHER): Payer: Medicare Other | Admitting: Internal Medicine

## 2011-11-25 VITALS — BP 110/62 | HR 73 | Temp 97.3°F | Ht 59.0 in | Wt 125.5 lb

## 2011-11-25 DIAGNOSIS — R0781 Pleurodynia: Secondary | ICD-10-CM | POA: Insufficient documentation

## 2011-11-25 DIAGNOSIS — I10 Essential (primary) hypertension: Secondary | ICD-10-CM

## 2011-11-25 DIAGNOSIS — R079 Chest pain, unspecified: Secondary | ICD-10-CM

## 2011-11-25 DIAGNOSIS — M545 Low back pain: Secondary | ICD-10-CM

## 2011-11-25 DIAGNOSIS — R0789 Other chest pain: Secondary | ICD-10-CM | POA: Insufficient documentation

## 2011-11-25 NOTE — Telephone Encounter (Signed)
Message copied by Pincus Sanes on Tue Nov 25, 2011  2:31 PM ------      Message from: Corwin Levins      Created: Tue Nov 25, 2011 12:39 PM       Ok to continue if recommended per PT that this would be a good idea            ----- Message -----         From: Vladimir Crofts Ewing         Sent: 11/25/2011  11:28 AM           To: Corwin Levins, MD            The patient would like to know if she needs to continue therapy

## 2011-11-25 NOTE — Assessment & Plan Note (Signed)
stable overall by hx and exam, most recent data reviewed with pt, and pt to continue medical treatment as before BP Readings from Last 3 Encounters:  11/25/11 110/62  11/17/11 110/70  10/27/11 112/80

## 2011-11-25 NOTE — Progress Notes (Signed)
Subjective:    Patient ID: Julie Fritz, female    DOB: December 15, 1930, 78 y.o.   MRN: [id number]  HPI  Here after roll and fall out of bed this am, unfortunately struck a low stool on the way to the floor, now with sharp mild to mod pain to right lateral chest wall, worse with deep inspiration, with a catch and some sob.   Pt denies fever, wt loss, night sweats, loss of appetite, or other constitutional symptoms   Denies urinary symptoms such as dysuria, frequency, urgency,or hematuria.  Pt denies chest pain, increased sob or doe, wheezing, orthopnea, PND, increased LE swelling, palpitations, dizziness or syncope except for the above.   Pt denies polydipsia, polyuria.   Denies back pain, but has been helped here with husband, only walked minimally. Past Medical History  Diagnosis Date  . FHx: migraine headaches   . ALLERGIC RHINITIS   . Hx of colonic polyps     hx of adenoma last 7/09  . Hyperlipidemia   . Osteoarthritis   . Peptic ulcer disease   . GERD (gastroesophageal reflux disease)   . Left knee DJD   . Right knee DJD   . Ileocecal valve syndrome   . IBS (irritable bowel syndrome)   . Anxiety   . Depression   . Lumbar spondylosis   . ALLERGIC RHINITIS 12/03/2006  . ANXIETY 05/31/2008  . CARPAL TUNNEL SYNDROME, BILATERAL 06/03/2007  . COLONIC POLYPS, HX OF 12/03/2006  . DEPRESSION 05/31/2008  . GERD 03/16/2007  . HYPERLIPIDEMIA 12/03/2006  . Irritable bowel syndrome 09/15/2007  . MIGRAINE HEADACHE 12/03/2006  . OSTEOARTHRITIS 12/03/2006  . PEPTIC ULCER DISEASE 12/03/2006  . Vitamin d deficiency 06/11/2010  . Cervical radicular pain 07/18/2010  . Hypertension   . HYPERTENSION 12/03/2006  . Hypothyroidism   . HYPOTHYROIDISM 12/03/2006  . Sinusitis   . OSTEOPOROSIS 11/2011t score -3.3  . Insomnia 04/23/2011   Past Surgical History  Procedure Date  . Thyroidectomy     02/2004  . Tonsillectomy     1937  . Back surgery 1990    LUMBAR LAMINECTOMY  . Right hand surgery trigger  surgury   . S/p right hand surgury/trigger surgury     Dr. Myerdierck  . Appendectomy     1979  . Abdominal hysterectomy     TAH  . Breast biopsy     1970    reports that she quit smoking about 7 years ago. Her smoking use included Cigarettes. She has quit using smokeless tobacco. She reports that she does not drink alcohol. Her drug history not on file. family history includes Alzheimer's disease in her mother; Dementia in her mother; Hypertension in her mother; and Leukemia in her sister. Allergies  Allergen Reactions  . Tetracycline     "Lightheaded."   Current Outpatient Prescriptions on File Prior to Visit  Medication Sig Dispense Refill  . atorvastatin (LIPITOR) 10 MG tablet TAKE 1 TABLET EVERY DAY  30 tablet  11  . calcitRIOL (ROCALTROL) 0.25 MCG capsule Take 1 capsule (0.25 mcg total) by mouth daily.  90 capsule  3  . Cholecalciferol (VITAMIN D3) 50000 UNITS CAPS Take 1 capsule by mouth once a week. For 2 months  8 capsule  0  . citalopram (CELEXA) 10 MG tablet Take 1 tablet (10 mg total) by mouth daily.  90 tablet  3  . lansoprazole (PREVACID) 30 MG capsule TAKE ONE CAPSULE BY MOUTH EVERY DAY  90 capsule  2  . levothyroxine (SYNTHROID)  75 MCG tablet Take 1 tablet (75 mcg total) by mouth daily.  90 tablet  3  . lisinopril (PRINIVIL,ZESTRIL) 5 MG tablet Take 1 tablet (5 mg total) by mouth daily.  90 tablet  3  . traMADol (ULTRAM) 50 MG tablet Take 1 tablet (50 mg total) by mouth every 8 (eight) hours as needed for pain.  60 tablet  1  . zolpidem (AMBIEN) 5 MG tablet Take 1 tablet (5 mg total) by mouth at bedtime as needed for sleep.  30 tablet  5  . fluticasone (FLONASE) 50 MCG/ACT nasal spray Place 2 sprays into the nose daily.  16 g  2   Current Facility-Administered Medications on File Prior to Visit  Medication Dose Route Frequency Provider Last Rate Last Dose  . denosumab (PROLIA) injection 60 mg  60 mg Subcutaneous Once Dara Lords, MD       Review of  Systems Review of Systems  Constitutional: Negative for diaphoresis and unexpected weight change.  HENT: Negative for tinnitus.   Eyes: Negative for photophobia and visual disturbance.  Respiratory: Negative for choking and stridor.   Gastrointestinal: Negative for vomiting and blood in stool.  Genitourinary: Negative for hematuria and decreased urine volume.  Musculoskeletal: Negative for acute joint swelling Skin: Negative for color change and wound.  Neurological: Negative for tremors and numbness.  Psychiatric/Behavioral: Negative for decreased concentration. The patient is not hyperactive.      Objective:   Physical Exam BP 110/62  Pulse 73  Temp 97.3 F (36.3 C) (Oral)  Ht 4\' 11"  (1.499 m)  Wt 125 lb 8 oz (56.926 kg)  BMI 25.35 kg/m2  SpO2 98% Physical Exam  VS noted Constitutional: Pt appears well-developed and well-nourished.  HENT: Head: Normocephalic.  Right Ear: External ear normal.  Left Ear: External ear normal.  Eyes: Conjunctivae and EOM are normal. Pupils are equal, round, and reactive to light.  Neck: Normal range of motion. Neck supple.  Cardiovascular: Normal rate and regular rhythm.   Pulmonary/Chest: Effort normal and breath sounds normal.  Abd:  Soft, NT, non-distended, + BS Neurological: Pt is alert. Not confused, motor/gait intact Skin: Skin is warm. No erythema. No rash or bruise Tender over right lower chest wall worst at the approx t7 anterior axillary line Spine: only tender in the midline approx l4 Psychiatric: Pt behavior is normal. Thought content normal. 2+ nervou    Assessment & Plan:

## 2011-11-25 NOTE — Telephone Encounter (Signed)
Informed the patients husband of ok to continue PT.

## 2011-11-25 NOTE — Patient Instructions (Addendum)
Please go to XRAY in the Basement for the x-ray test Continue all other medications as before You will be contacted by phone if any changes need to be made immediately.  Otherwise, you will receive a letter about your results with an explanation.

## 2011-11-25 NOTE — Assessment & Plan Note (Signed)
Possibly incidental, for lumbar film - r/o fx

## 2011-11-25 NOTE — Assessment & Plan Note (Signed)
No bruise or obvious fx, for rib film and cxr - r/o fx and pneumothorax givne the sob

## 2011-12-15 ENCOUNTER — Telehealth: Payer: Self-pay

## 2011-12-15 NOTE — Telephone Encounter (Signed)
Ok for verbal if this is ok 

## 2011-12-15 NOTE — Telephone Encounter (Signed)
[facility] called requesting continuation of orders for 2/week x 2 weeks and 1/week for 1 week for gait training, home exercise, strengthening, safety, fall precaution and balance.

## 2011-12-15 NOTE — Telephone Encounter (Signed)
Called [facility] informed of verbal ok.

## 2011-12-30 ENCOUNTER — Other Ambulatory Visit: Payer: Self-pay | Admitting: Internal Medicine

## 2012-01-05 ENCOUNTER — Other Ambulatory Visit (INDEPENDENT_AMBULATORY_CARE_PROVIDER_SITE_OTHER): Payer: Medicare Other

## 2012-01-05 ENCOUNTER — Encounter: Payer: Self-pay | Admitting: Internal Medicine

## 2012-01-05 ENCOUNTER — Ambulatory Visit (INDEPENDENT_AMBULATORY_CARE_PROVIDER_SITE_OTHER): Payer: Medicare Other | Admitting: Internal Medicine

## 2012-01-05 VITALS — BP 112/80 | HR 71 | Temp 97.0°F | Ht <= 58 in | Wt 127.0 lb

## 2012-01-05 DIAGNOSIS — I10 Essential (primary) hypertension: Secondary | ICD-10-CM

## 2012-01-05 DIAGNOSIS — F411 Generalized anxiety disorder: Secondary | ICD-10-CM

## 2012-01-05 DIAGNOSIS — Z0000 Encounter for general adult medical examination without abnormal findings: Secondary | ICD-10-CM

## 2012-01-05 DIAGNOSIS — R21 Rash and other nonspecific skin eruption: Secondary | ICD-10-CM | POA: Insufficient documentation

## 2012-01-05 DIAGNOSIS — Z23 Encounter for immunization: Secondary | ICD-10-CM

## 2012-01-05 LAB — URINALYSIS, ROUTINE W REFLEX MICROSCOPIC
Nitrite: NEGATIVE
Specific Gravity, Urine: 1.02 (ref 1.000–1.030)
Total Protein, Urine: NEGATIVE
pH: 6 (ref 5.0–8.0)

## 2012-01-05 MED ORDER — METHYLPREDNISOLONE ACETATE 80 MG/ML IJ SUSP
120.0000 mg | Freq: Once | INTRAMUSCULAR | Status: AC
  Administered 2012-01-05: 120 mg via INTRAMUSCULAR

## 2012-01-05 MED ORDER — PREDNISONE 10 MG PO TABS: ORAL_TABLET | ORAL | Status: DC

## 2012-01-05 NOTE — Patient Instructions (Addendum)
You had the flu shot today, and the steroid shot Take all new medications as prescribed Continue all other medications as before Please go to LAB for the urine test to be done today since this was not done at our last visit You will be contacted by phone if any changes need to be made immediately.  Otherwise, you will receive a letter about your results with an explanation. Please remember to sign up for My Chart at your earliest convenience, as this will be important to you in the future with finding out test results. Please return in 6 mo with Lab testing done 3-5 days before  OK to cancel the appointment next wk

## 2012-01-08 ENCOUNTER — Ambulatory Visit: Payer: Medicare Other | Admitting: Internal Medicine

## 2012-01-11 ENCOUNTER — Encounter: Payer: Self-pay | Admitting: Internal Medicine

## 2012-01-11 NOTE — Assessment & Plan Note (Signed)
stable overall by hx and exam, most recent data reviewed with pt, and pt to continue medical treatment as before BP Readings from Last 3 Encounters:  01/05/12 112/80  11/25/11 110/62  11/17/11 110/70

## 2012-01-11 NOTE — Assessment & Plan Note (Signed)
Improved and stable overall by hx and exam, most recent data reviewed with pt, and pt to continue medical treatment as before

## 2012-01-11 NOTE — Assessment & Plan Note (Addendum)
C/w prob allergic rash , etiology not clear, for depomedrol IM, and predpack asd, benadryl otc, cont all other meds,  to f/u any worsening symptoms or concerns

## 2012-01-11 NOTE — Progress Notes (Signed)
Subjective:    Patient ID: Julie Fritz, female    DOB: 1930-08-19, 78 y.o.   MRN: [id number]  HPI  Here with acute onset mild 1 wk ? Hive like rash to the arms and few to the legs with itch but dont come and go like hives,  Just stay.  No recent med changes, otc or o/w.   Pt denies fever, wt loss, night sweats, loss of appetite, or other constitutional symptoms  No prior hx of same. No other rash, swelling, lip or tongue swelling, cough, sob/wheezing.  Pt denies chest pain, increased sob or doe, wheezing, orthopnea, PND, increased LE swelling, palpitations, dizziness or syncope. Pt denies new neurological symptoms such as new headache, or facial or extremity weakness or numbness   Pt denies polydipsia, polyuria, Denies worsening depressive symptoms, suicidal ideation, or panic, though has ongoing anxiety, not increased recently.  Past Medical History  Diagnosis Date  . FHx: migraine headaches   . ALLERGIC RHINITIS   . Hx of colonic polyps     hx of adenoma last 7/09  . Hyperlipidemia   . Osteoarthritis   . Peptic ulcer disease   . GERD (gastroesophageal reflux disease)   . Left knee DJD   . Right knee DJD   . Ileocecal valve syndrome   . IBS (irritable bowel syndrome)   . Anxiety   . Depression   . Lumbar spondylosis   . ALLERGIC RHINITIS 12/03/2006  . ANXIETY 05/31/2008  . CARPAL TUNNEL SYNDROME, BILATERAL 06/03/2007  . COLONIC POLYPS, HX OF 12/03/2006  . DEPRESSION 05/31/2008  . GERD 03/16/2007  . HYPERLIPIDEMIA 12/03/2006  . Irritable bowel syndrome 09/15/2007  . MIGRAINE HEADACHE 12/03/2006  . OSTEOARTHRITIS 12/03/2006  . PEPTIC ULCER DISEASE 12/03/2006  . Vitamin D deficiency 06/11/2010  . Cervical radicular pain 07/18/2010  . Hypertension   . HYPERTENSION 12/03/2006  . Hypothyroidism   . HYPOTHYROIDISM 12/03/2006  . Sinusitis   . OSTEOPOROSIS 11/2011t score -3.3  . Insomnia 04/23/2011   Past Surgical History  Procedure Date  . Thyroidectomy     02/2004  . Tonsillectomy    1937  . Back surgery 1990    LUMBAR LAMINECTOMY  . Right hand surgery trigger surgury   . S/p right hand surgury/trigger surgury     Dr. Myerdierck  . Appendectomy     1979  . Abdominal hysterectomy     TAH  . Breast biopsy     1970    reports that she quit smoking about 7 years ago. Her smoking use included Cigarettes. She has quit using smokeless tobacco. She reports that she does not drink alcohol. Her drug history not on file. family history includes Alzheimer's disease in her mother; Dementia in her mother; Hypertension in her mother; and Leukemia in her sister. Allergies  Allergen Reactions  . Tetracycline     "Lightheaded."   Current Outpatient Prescriptions on File Prior to Visit  Medication Sig Dispense Refill  . atorvastatin (LIPITOR) 10 MG tablet TAKE 1 TABLET EVERY DAY  30 tablet  11  . calcitRIOL (ROCALTROL) 0.25 MCG capsule Take 1 capsule (0.25 mcg total) by mouth daily.  90 capsule  3  . Cholecalciferol (VITAMIN D3) 50000 UNITS CAPS Take 1 capsule by mouth once a week. For 2 months  8 capsule  0  . citalopram (CELEXA) 10 MG tablet Take 1 tablet (10 mg total) by mouth daily.  90 tablet  3  . lansoprazole (PREVACID) 30 MG capsule TAKE ONE CAPSULE BY  MOUTH EVERY DAY  90 capsule  2  . levothyroxine (SYNTHROID, LEVOTHROID) 75 MCG tablet TAKE 1 TABLET BY MOUTH EVERY DAY  90 tablet  3  . lisinopril (PRINIVIL,ZESTRIL) 5 MG tablet Take 1 tablet (5 mg total) by mouth daily.  90 tablet  3  . zolpidem (AMBIEN) 5 MG tablet Take 1 tablet (5 mg total) by mouth at bedtime as needed for sleep.  30 tablet  5  . fluticasone (FLONASE) 50 MCG/ACT nasal spray Place 2 sprays into the nose daily.  16 g  2   Current Facility-Administered Medications on File Prior to Visit  Medication Dose Route Frequency Provider Last Rate Last Dose  . denosumab (PROLIA) injection 60 mg  60 mg Subcutaneous Once Dara Lords, MD       Review of Systems  Constitutional: Negative for diaphoresis and  unexpected weight change.  HENT: Negative for tinnitus.   Eyes: Negative for photophobia and visual disturbance.  Respiratory: Negative for choking and stridor.   Gastrointestinal: Negative for vomiting and blood in stool.  Genitourinary: Negative for hematuria and decreased urine volume.  Musculoskeletal: Negative for gait problem.  Skin: Negative for color change and wound. other than above Neurological: Negative for tremors and numbness.  Psychiatric/Behavioral: Negative for decreased concentration. The patient is not hyperactive.       Objective:   Physical Exam BP 112/80  Pulse 71  Temp 97 F (36.1 C) (Oral)  Ht 4\' 6"  (1.372 m)  Wt 127 lb (57.607 kg)  BMI 30.62 kg/m2  SpO2 93% Physical Exam  VS noted Constitutional: Pt appears well-developed and well-nourished.  HENT: Head: Normocephalic.  Right Ear: External ear normal.  Left Ear: External ear normal.  Eyes: Conjunctivae and EOM are normal. Pupils are equal, round, and reactive to light.  Neck: Normal range of motion. Neck supple.  Cardiovascular: Normal rate and regular rhythm.   Pulmonary/Chest: Effort normal and breath sounds normal.  Neurological: Pt is alert. Not confused  Skin: Skin is warm. No erythema.  Psychiatric: Pt behavior is normal. Thought content normal. does not appear depressed affect, minor anxious today    Assessment & Plan:

## 2012-01-23 DIAGNOSIS — M810 Age-related osteoporosis without current pathological fracture: Secondary | ICD-10-CM

## 2012-01-23 HISTORY — DX: Age-related osteoporosis without current pathological fracture: M81.0

## 2012-02-10 ENCOUNTER — Ambulatory Visit (INDEPENDENT_AMBULATORY_CARE_PROVIDER_SITE_OTHER): Payer: Medicare Other

## 2012-02-10 DIAGNOSIS — M810 Age-related osteoporosis without current pathological fracture: Secondary | ICD-10-CM

## 2012-02-10 MED ORDER — DENOSUMAB 60 MG/ML ~~LOC~~ SOLN
60.0000 mg | Freq: Once | SUBCUTANEOUS | Status: AC
  Administered 2012-02-10: 60 mg via SUBCUTANEOUS

## 2012-02-13 ENCOUNTER — Encounter: Payer: Self-pay | Admitting: Gynecology

## 2012-02-17 ENCOUNTER — Encounter: Payer: Self-pay | Admitting: Internal Medicine

## 2012-02-20 ENCOUNTER — Encounter: Payer: Self-pay | Admitting: Internal Medicine

## 2012-02-24 ENCOUNTER — Encounter: Payer: Self-pay | Admitting: *Deleted

## 2012-02-24 NOTE — Progress Notes (Signed)
Patient ID: Julie Fritz, female   DOB: 03/15/1931, 78 y.o.   MRN: [id number] I went to call patient regarding Prolia injection. She had her previous injection June 14 and was not due til after 12/14. When I looked into patients chart she received her Prolia at another medical facility. Therefore patient was not contacted by me.  Scherry Ran CMA

## 2012-03-22 ENCOUNTER — Other Ambulatory Visit: Payer: Self-pay | Admitting: Internal Medicine

## 2012-03-23 ENCOUNTER — Ambulatory Visit (INDEPENDENT_AMBULATORY_CARE_PROVIDER_SITE_OTHER): Payer: Medicare Other | Admitting: Gynecology

## 2012-03-23 ENCOUNTER — Encounter: Payer: Self-pay | Admitting: Gynecology

## 2012-03-23 VITALS — BP 110/72 | Ht <= 58 in | Wt 135.0 lb

## 2012-03-23 DIAGNOSIS — N949 Unspecified condition associated with female genital organs and menstrual cycle: Secondary | ICD-10-CM

## 2012-03-23 DIAGNOSIS — N898 Other specified noninflammatory disorders of vagina: Secondary | ICD-10-CM

## 2012-03-23 DIAGNOSIS — M810 Age-related osteoporosis without current pathological fracture: Secondary | ICD-10-CM

## 2012-03-23 DIAGNOSIS — N952 Postmenopausal atrophic vaginitis: Secondary | ICD-10-CM

## 2012-03-23 LAB — WET PREP FOR TRICH, YEAST, CLUE
Clue Cells Wet Prep HPF POC: NONE SEEN
Yeast Wet Prep HPF POC: NONE SEEN

## 2012-03-23 NOTE — Progress Notes (Signed)
Julie Fritz 30-Oct-1930 [id number]        78 y.o.  G0P0 for follow up exam.    Past medical history,surgical history, medications, allergies, family history and social history were all reviewed and documented in the EPIC chart. ROS:  Was performed and pertinent positives and negatives are included in the history.  Exam: Biomedical scientist Filed Vitals:   03/23/12 1243  BP: 110/72  Height: 4' 7.5" (1.41 m)  Weight: 135 lb (61.236 kg)   General appearance  Normal Skin grossly normal Head/Neck normal with no cervical or supraclavicular adenopathy thyroid normal Lungs  clear Cardiac RR, without RMG Abdominal  soft, nontender, without masses, organomegaly or hernia Breasts  examined lying and sitting without masses, retractions, discharge or axillary adenopathy. Pelvic  Ext/BUS/vagina  normal with atrophic changes  Adnexa  Without masses or tenderness    Anus and perineum  normal   Rectovaginal  normal sphincter tone without palpated masses or tenderness.    Assessment/Plan:  78 y.o. G0P0 female for exam.   1. Patient knows her urine is regard at times. Almost black. Has happened twice in the toilet bowl. Does not feel this blood from bowel movements.  Exam is negative to include a rectal exam.  Check urinalysis today.  OC LITE kit given to check the stool for blood and the patient knows importance of mailing this back and confirming that we receive it. If persists will follow up for further evaluation. If no recurrence then we'll monitor if above testing negative. 2. Vaginal odor. Noticed before although does not notice now. Her exam is negative wet prep negative. We'll monitor and represent if recurs. 3. Osteoporosis. Recent fall this past year with pelvic fracture.  DEXA 2013 with T score -2.7. Most recently started Prolia this past year and received her second shot last month at Dr. Raphael Gibney office. She'll continue to follow with him for this. 4. Pap smear 2010. No Pap smear done  today. Patient status post hysterectomy and over the age of 77 with no history of significant abnormal Pap smears previously. Plan discontinue screening. 5. Mammography March 2013. Will repeat this coming year and continue with annual mammography. SBE monthly reviewed. 6. Colonoscopy 2009. We'll repeat at their recommended interval. 7. Health maintenance. No blood work done this is all done through Dr. Raphael Gibney office who she sees on regular basis. Follow up one year sooner if urinary changes recur.   Dara Lords MD, 2:03 PM 03/23/2012

## 2012-03-23 NOTE — Addendum Note (Signed)
Addended by: Bertram Savin A on: 03/23/2012 02:10 PM   Modules accepted: Orders

## 2012-03-23 NOTE — Patient Instructions (Signed)
Follow up in one year for annual exam 

## 2012-03-24 LAB — URINALYSIS W MICROSCOPIC + REFLEX CULTURE
Bacteria, UA: NONE SEEN
Bilirubin Urine: NEGATIVE
Crystals: NONE SEEN
Hgb urine dipstick: NEGATIVE
Ketones, ur: NEGATIVE mg/dL
Nitrite: NEGATIVE
Urobilinogen, UA: 0.2 mg/dL (ref 0.0–1.0)

## 2012-03-25 ENCOUNTER — Other Ambulatory Visit: Payer: Self-pay | Admitting: Gynecology

## 2012-03-25 ENCOUNTER — Telehealth: Payer: Self-pay | Admitting: Gynecology

## 2012-03-25 MED ORDER — NITROFURANTOIN MONOHYD MACRO 100 MG PO CAPS: 100.0000 mg | ORAL_CAPSULE | Freq: Two times a day (BID) | ORAL | Status: DC

## 2012-03-25 NOTE — Telephone Encounter (Signed)
Patient informed.  Rx e-scribed.    Patient expressed concern "why does this keep happening" the recurrent UTI's.  I told her that if she is concerned that she could see urologist but important to go ahead and take Rx now to treat infection.  She sees Dr. Isabel Caprice and said she was told back in July that she has a cyst in her bladder and wondered if that could be the issue with the infections.   I told her I would be happy to fax her u/a-urine culture results to him with a note that she will be calling for appointment.  She is going out of town on Jan 8 and said she will not be able to see him until after the 20th.  Again, I stressed importance of treating UTI now and she said she will pick up the Rx in the morning and start it.

## 2012-03-25 NOTE — Telephone Encounter (Signed)
Tell patient urinalysis shows a urinary tract infection. This may account for the intermittent vaginal odor that she was having. Recommend treatment with Macrodantin 100 mg twice daily x7 days.

## 2012-03-25 NOTE — Telephone Encounter (Signed)
Left message with husband for patient to call me back

## 2012-03-26 NOTE — Telephone Encounter (Signed)
Remus Loffler already addressed dec 30 per DR Plotnikov

## 2012-03-26 NOTE — Addendum Note (Signed)
Addended by: EWING, ROBIN B on: 03/26/2012 09:18 AM   Modules accepted: Orders  

## 2012-03-26 NOTE — Addendum Note (Signed)
Addended by: Aneliz Carbary W on: 03/26/2012 01:19 PM   Modules accepted: Orders  

## 2012-03-29 ENCOUNTER — Other Ambulatory Visit: Payer: Self-pay | Admitting: *Deleted

## 2012-03-29 DIAGNOSIS — Z1211 Encounter for screening for malignant neoplasm of colon: Secondary | ICD-10-CM

## 2012-03-30 ENCOUNTER — Other Ambulatory Visit: Payer: Self-pay | Admitting: Gynecology

## 2012-03-30 DIAGNOSIS — Z1211 Encounter for screening for malignant neoplasm of colon: Secondary | ICD-10-CM

## 2012-04-10 ENCOUNTER — Other Ambulatory Visit: Payer: Self-pay | Admitting: Internal Medicine

## 2012-04-12 NOTE — Telephone Encounter (Signed)
Done hardcopy to robin  

## 2012-04-12 NOTE — Telephone Encounter (Signed)
Faxed hardcopy to pharmacy. 

## 2012-06-03 ENCOUNTER — Telehealth: Payer: Self-pay

## 2012-06-03 MED ORDER — LANSOPRAZOLE 30 MG PO CPDR: 30.0000 mg | DELAYED_RELEASE_CAPSULE | Freq: Every day | ORAL | Status: DC

## 2012-06-03 NOTE — Telephone Encounter (Signed)
Refilled Lansoprazole

## 2012-07-15 ENCOUNTER — Encounter: Payer: Self-pay | Admitting: Internal Medicine

## 2012-07-15 ENCOUNTER — Ambulatory Visit (INDEPENDENT_AMBULATORY_CARE_PROVIDER_SITE_OTHER): Payer: Medicare Other | Admitting: Internal Medicine

## 2012-07-15 VITALS — BP 110/72 | HR 78 | Temp 97.0°F | Ht 59.0 in | Wt 137.0 lb

## 2012-07-15 DIAGNOSIS — Z0000 Encounter for general adult medical examination without abnormal findings: Secondary | ICD-10-CM

## 2012-07-15 DIAGNOSIS — F411 Generalized anxiety disorder: Secondary | ICD-10-CM

## 2012-07-15 DIAGNOSIS — M810 Age-related osteoporosis without current pathological fracture: Secondary | ICD-10-CM

## 2012-07-15 MED ORDER — ZOLPIDEM TARTRATE 5 MG PO TABS: ORAL_TABLET | ORAL | Status: DC

## 2012-07-15 MED ORDER — CALCITRIOL 0.25 MCG PO CAPS: 0.2500 ug | ORAL_CAPSULE | Freq: Every day | ORAL | Status: AC

## 2012-07-15 NOTE — Assessment & Plan Note (Signed)

## 2012-07-15 NOTE — Progress Notes (Signed)
Subjective:    Patient ID: Julie Fritz, female    DOB: Sep 11, 1930, 79 y.o.   MRN: [id number]  HPI  Here for wellness and f/u;  Overall doing ok;  Pt denies CP, worsening SOB, DOE, wheezing, orthopnea, PND, worsening LE edema, palpitations, dizziness or syncope.  Pt denies neurological change such as new headache, facial or extremity weakness.  Pt denies polydipsia, polyuria, or low sugar symptoms. Pt states overall good compliance with treatment and medications, good tolerability, and has been trying to follow lower cholesterol diet.  Pt denies worsening depressive symptoms, suicidal ideation or panic. No fever, night sweats, wt loss, loss of appetite, or other constitutional symptoms.  Pt states good ability with ADL's, has low fall risk, home safety reviewed and adequate, no other significant changes in hearing or vision, and only occasionally active with exercise. Did take one tramadol yesterday unfort with dizziness and GI upset.  Would like to stop the citalpram for now, as she feels her nerves are better Past Medical History  Diagnosis Date  . FHx: migraine headaches   . ALLERGIC RHINITIS   . Hx of colonic polyps     hx of adenoma last 7/09  . Hyperlipidemia   . Osteoarthritis   . Peptic ulcer disease   . GERD (gastroesophageal reflux disease)   . Left knee DJD   . Right knee DJD   . Ileocecal valve syndrome   . IBS (irritable bowel syndrome)   . Anxiety   . Depression   . Lumbar spondylosis   . ALLERGIC RHINITIS 12/03/2006  . ANXIETY 05/31/2008  . CARPAL TUNNEL SYNDROME, BILATERAL 06/03/2007  . COLONIC POLYPS, HX OF 12/03/2006  . DEPRESSION 05/31/2008  . GERD 03/16/2007  . HYPERLIPIDEMIA 12/03/2006  . Irritable bowel syndrome 09/15/2007  . MIGRAINE HEADACHE 12/03/2006  . OSTEOARTHRITIS 12/03/2006  . PEPTIC ULCER DISEASE 12/03/2006  . Vitamin D deficiency 06/11/2010  . Cervical radicular pain 07/18/2010  . Hypertension   . HYPERTENSION 12/03/2006  . Hypothyroidism   .  HYPOTHYROIDISM 12/03/2006  . Sinusitis   . OSTEOPOROSIS 01/2012    t score -2.7  Prolia 08/2011  . Insomnia 04/23/2011   Past Surgical History  Procedure Laterality Date  . Thyroidectomy      02/2004  . Tonsillectomy      1937  . Back surgery  1990    LUMBAR LAMINECTOMY  . Right hand surgery trigger surgury    . S/p right hand surgury/trigger surgury      Dr. Myerdierck  . Appendectomy      1979  . Abdominal hysterectomy      TAH  . Breast biopsy      1970    reports that she quit smoking about 8 years ago. Her smoking use included Cigarettes. She smoked 0.00 packs per day. She has quit using smokeless tobacco. She reports that she does not drink alcohol. Her drug history is not on file. family history includes Alzheimer's disease in her mother; Dementia in her mother; Hypertension in her mother; and Leukemia in her sister. Allergies  Allergen Reactions  . Tetracycline     "Lightheaded."  . Tramadol     Dizzy, GI upset   Current Outpatient Prescriptions on File Prior to Visit  Medication Sig Dispense Refill  . atorvastatin (LIPITOR) 10 MG tablet TAKE 1 TABLET EVERY DAY  30 tablet  11  . calcitRIOL (ROCALTROL) 0.25 MCG capsule Take 1 capsule (0.25 mcg total) by mouth daily.  90 capsule  3  .  Cholecalciferol (VITAMIN D3) 50000 UNITS CAPS Take 1 capsule by mouth once a week. For 2 months  8 capsule  0  . citalopram (CELEXA) 10 MG tablet Take 1 tablet (10 mg total) by mouth daily.  90 tablet  3  . lansoprazole (PREVACID) 30 MG capsule Take 1 capsule (30 mg total) by mouth daily.  90 capsule  2  . levothyroxine (SYNTHROID, LEVOTHROID) 75 MCG tablet TAKE 1 TABLET BY MOUTH EVERY DAY  90 tablet  3  . lisinopril (PRINIVIL,ZESTRIL) 5 MG tablet Take 1 tablet (5 mg total) by mouth daily.  90 tablet  3  . zolpidem (AMBIEN) 5 MG tablet TAKE 1 TABLET BY MOUTH AT BEDTIME AS NEEDED FOR SLEEP  30 tablet  3  . fluticasone (FLONASE) 50 MCG/ACT nasal spray Place 2 sprays into the nose daily.  16 g   2   Current Facility-Administered Medications on File Prior to Visit  Medication Dose Route Frequency Provider Last Rate Last Dose  . denosumab (PROLIA) injection 60 mg  60 mg Subcutaneous Once Dara Lords, MD       Review of Systems Constitutional: Negative for diaphoresis, activity change, appetite change or unexpected weight change.  HENT: Negative for hearing loss, ear pain, facial swelling, mouth sores and neck stiffness.   Eyes: Negative for pain, redness and visual disturbance.  Respiratory: Negative for shortness of breath and wheezing.   Cardiovascular: Negative for chest pain and palpitations.  Gastrointestinal: Negative for diarrhea, blood in stool, abdominal distention or other pain Genitourinary: Negative for hematuria, flank pain or change in urine volume.  Musculoskeletal: Negative for myalgias and joint swelling.  Skin: Negative for color change and wound.  Neurological: Negative for syncope and numbness. other than noted Hematological: Negative for adenopathy.  Psychiatric/Behavioral: Negative for hallucinations, self-injury, decreased concentration and agitation.      Objective:   Physical Exam BP 110/72  Pulse 78  Temp(Src) 97 F (36.1 C) (Oral)  Ht 4\' 11"  (1.499 m)  Wt 137 lb (62.143 kg)  BMI 27.66 kg/m2  SpO2 97% VS noted,  Constitutional: Pt is oriented to person, place, and time. Appears well-developed and well-nourished.  Head: Normocephalic and atraumatic.  Right Ear: External ear normal.  Left Ear: External ear normal.  Nose: Nose normal.  Mouth/Throat: Oropharynx is clear and moist.  Eyes: Conjunctivae and EOM are normal. Pupils are equal, round, and reactive to light.  Neck: Normal range of motion. Neck supple. No JVD present. No tracheal deviation present.  Cardiovascular: Normal rate, regular rhythm, normal heart sounds and intact distal pulses.   Pulmonary/Chest: Effort normal and breath sounds normal.  Abdominal: Soft. Bowel sounds are  normal. There is no tenderness. No HSM  Musculoskeletal: Normal range of motion. Exhibits no edema.  Lymphadenopathy:  Has no cervical adenopathy.  Neurological: Pt is alert and oriented to person, place, and time. Pt has normal reflexes. No cranial nerve deficit.  Skin: Skin is warm and dry. No rash noted.  Psychiatric:  Has 1+ nervous. Behavior is normal.     Assessment & Plan:

## 2012-07-15 NOTE — Patient Instructions (Addendum)
You should be due for next Prolia in May 2014 Your next bone density would be due at 2-3 yrs after Nov 2013 Ok to take an occasional alleve OTC for pain, and even tylenol 8 hr as needed at 1 every 8 hrs as needed OK to stop the citalopram (and the prednisone) Please continue all other medications as before, and refills have been done if requested - the ambien and calcitriol Please continue your efforts at being more active, low cholesterol diet, and weight control. You are otherwise up to date with prevention measures today. Please go to the LAB in the Basement (turn left off the elevator) for the tests to be done today You will be contacted by phone if any changes need to be made immediately.  Otherwise, you will receive a letter about your results with an explanation Please remember to sign up for My Chart if you have not done so, as this will be important to you in the future with finding out test results, communicating by private email, and scheduling acute appointments online when needed. Please return in 6 months, or sooner if needed

## 2012-07-15 NOTE — Assessment & Plan Note (Signed)
Ok to stop the citalopram for now,  to f/u any worsening symptoms or concerns

## 2012-07-15 NOTE — Assessment & Plan Note (Signed)
Last dxa nov 2013, would be due for f/u at 2-3 yrs, to cont prolia as she does

## 2012-07-16 ENCOUNTER — Other Ambulatory Visit (INDEPENDENT_AMBULATORY_CARE_PROVIDER_SITE_OTHER): Payer: Medicare Other

## 2012-07-16 ENCOUNTER — Other Ambulatory Visit: Payer: Self-pay | Admitting: Internal Medicine

## 2012-07-16 DIAGNOSIS — Z0000 Encounter for general adult medical examination without abnormal findings: Secondary | ICD-10-CM

## 2012-07-16 LAB — LIPID PANEL
HDL: 49.9 mg/dL (ref >39.00)
LDL Cholesterol: 100 mg/dL — ABNORMAL HIGH (ref 0–99)
Total CHOL/HDL Ratio: 4
Triglycerides: 126 mg/dL (ref 0.0–149.0)
VLDL: 25.2 mg/dL (ref 0.0–40.0)

## 2012-07-16 LAB — CBC WITH DIFFERENTIAL/PLATELET
Basophils Relative: 0.5 % (ref 0.0–3.0)
Eosinophils Absolute: 0.3 10*3/uL (ref 0.0–0.7)
HCT: 38.5 % (ref 36.0–46.0)
Hemoglobin: 13 g/dL (ref 12.0–15.0)
Lymphocytes Relative: 31.3 % (ref 12.0–46.0)
Lymphs Abs: 2.8 10*3/uL (ref 0.7–4.0)
MCHC: 33.9 g/dL (ref 30.0–36.0)
Neutro Abs: 4.9 10*3/uL (ref 1.4–7.7)
RBC: 4.46 Mil/uL (ref 3.87–5.11)

## 2012-07-16 LAB — HEPATIC FUNCTION PANEL
Alkaline Phosphatase: 77 U/L (ref 39–117)
Bilirubin, Direct: 0.1 mg/dL (ref 0.0–0.3)
Total Bilirubin: 0.4 mg/dL (ref 0.3–1.2)
Total Protein: 7.3 g/dL (ref 6.0–8.3)

## 2012-07-16 LAB — URINALYSIS, ROUTINE W REFLEX MICROSCOPIC
Bilirubin Urine: NEGATIVE
Ketones, ur: NEGATIVE
Total Protein, Urine: NEGATIVE
Urine Glucose: NEGATIVE
Urobilinogen, UA: 0.2 (ref 0.0–1.0)

## 2012-07-16 LAB — BASIC METABOLIC PANEL
CO2: 23 mEq/L (ref 19–32)
Calcium: 8.9 mg/dL (ref 8.4–10.5)
Creatinine, Ser: 1.2 mg/dL (ref 0.4–1.2)
GFR: 46.85 mL/min — ABNORMAL LOW (ref >60.00)
Sodium: 137 mEq/L (ref 135–145)

## 2012-07-19 ENCOUNTER — Encounter: Payer: Self-pay | Admitting: Internal Medicine

## 2012-07-19 ENCOUNTER — Telehealth: Payer: Self-pay | Admitting: Internal Medicine

## 2012-07-19 MED ORDER — LEVOTHYROXINE SODIUM 88 MCG PO TABS: 88.0000 ug | ORAL_TABLET | Freq: Every day | ORAL | Status: DC

## 2012-07-19 NOTE — Telephone Encounter (Signed)
Pt's spouse Windy Fast called req a phone call from the nurse concern about the result that was given to the pt. Please call her spouse back. Pt's spouse stated that Mrs. Palinkas does not understand the result and would like for the nurse to explain to her spouse Mr. Fantini.

## 2012-07-20 NOTE — Telephone Encounter (Signed)
Spoke to the husband this morning informed per MD instructions of the patients thyroid lab results and thyroid medication increase.

## 2012-07-30 ENCOUNTER — Encounter: Payer: Self-pay | Admitting: Internal Medicine

## 2012-07-30 ENCOUNTER — Ambulatory Visit (INDEPENDENT_AMBULATORY_CARE_PROVIDER_SITE_OTHER)
Admission: RE | Admit: 2012-07-30 | Discharge: 2012-07-30 | Disposition: A | Discharge status: Home / Self Care | Payer: Medicare Other | Source: Ambulatory Visit | Attending: Internal Medicine | Admitting: Internal Medicine

## 2012-07-30 ENCOUNTER — Other Ambulatory Visit: Payer: Self-pay | Admitting: Internal Medicine

## 2012-07-30 ENCOUNTER — Other Ambulatory Visit (INDEPENDENT_AMBULATORY_CARE_PROVIDER_SITE_OTHER): Payer: Medicare Other

## 2012-07-30 ENCOUNTER — Ambulatory Visit (INDEPENDENT_AMBULATORY_CARE_PROVIDER_SITE_OTHER): Payer: Medicare Other | Admitting: Internal Medicine

## 2012-07-30 ENCOUNTER — Other Ambulatory Visit: Payer: Medicare Other

## 2012-07-30 VITALS — BP 100/78 | HR 96 | Temp 98.0°F | Wt 139.1 lb

## 2012-07-30 DIAGNOSIS — R51 Headache: Secondary | ICD-10-CM

## 2012-07-30 DIAGNOSIS — R310 Gross hematuria: Secondary | ICD-10-CM

## 2012-07-30 DIAGNOSIS — R519 Headache, unspecified: Secondary | ICD-10-CM | POA: Insufficient documentation

## 2012-07-30 DIAGNOSIS — R319 Hematuria, unspecified: Secondary | ICD-10-CM

## 2012-07-30 DIAGNOSIS — F05 Delirium due to known physiological condition: Secondary | ICD-10-CM

## 2012-07-30 DIAGNOSIS — S0990XA Unspecified injury of head, initial encounter: Secondary | ICD-10-CM

## 2012-07-30 LAB — URINALYSIS, ROUTINE W REFLEX MICROSCOPIC
Nitrite: NEGATIVE
Specific Gravity, Urine: <=1.005 (ref 1.000–1.030)
pH: 6 (ref 5.0–8.0)

## 2012-07-30 LAB — CBC WITH DIFFERENTIAL/PLATELET
Basophils Absolute: 0 10*3/uL (ref 0.0–0.1)
Lymphocytes Relative: 35.2 % (ref 12.0–46.0)
Monocytes Relative: 10.6 % (ref 3.0–12.0)
Platelets: 218 10*3/uL (ref 150.0–400.0)
RDW: 14.6 % (ref 11.5–14.6)
WBC: 6.4 10*3/uL (ref 4.5–10.5)

## 2012-07-30 LAB — POCT URINALYSIS DIPSTICK
Bilirubin, UA: NEGATIVE
Glucose, UA: NEGATIVE
Ketones, UA: NEGATIVE
Leukocytes, UA: NEGATIVE
Nitrite, UA: NEGATIVE
pH, UA: 5

## 2012-07-30 LAB — BASIC METABOLIC PANEL
BUN: 25 mg/dL — ABNORMAL HIGH (ref 6–23)
CO2: 30 mEq/L (ref 19–32)
Calcium: 9.8 mg/dL (ref 8.4–10.5)
Glucose, Bld: 92 mg/dL (ref 70–99)
Potassium: 3.8 mEq/L (ref 3.5–5.1)
Sodium: 138 mEq/L (ref 135–145)

## 2012-07-30 LAB — HEPATIC FUNCTION PANEL
AST: 19 U/L (ref 0–37)
Albumin: 3.9 g/dL (ref 3.5–5.2)
Alkaline Phosphatase: 77 U/L (ref 39–117)
Total Protein: 7.3 g/dL (ref 6.0–8.3)

## 2012-07-30 MED ORDER — CEFTRIAXONE SODIUM 1 G IJ SOLR
1.0000 g | Freq: Once | INTRAMUSCULAR | Status: AC
  Administered 2012-07-30: 1 g via INTRAMUSCULAR

## 2012-07-30 MED ORDER — CEPHALEXIN 500 MG PO CAPS: 500.0000 mg | ORAL_CAPSULE | Freq: Four times a day (QID) | ORAL | Status: DC

## 2012-07-30 NOTE — Assessment & Plan Note (Signed)
?   Significance - for head CT - r/o bleed given confusion, and HA

## 2012-07-30 NOTE — Patient Instructions (Addendum)
You had the antibiotic shot today (rocephin) Please take all new medication as prescribed - the cephalexin Your urine specimen will be sent for culture (but this takes several days to come back with a result) Please go to the LAB in the Basement (turn left off the elevator) for the tests to be done today You will be contacted regarding the referral for: Head CT (to see the PCC's now) Please continue all other medications as before, and refills have been done if requested. Please have the pharmacy call with any other refills you may need. You will be contacted by phone if any changes need to be made immediately.  Otherwise, you will receive a letter about your results with an explanation Please remember to sign up for My Chart if you have not done so, as this will be important to you in the future with finding out test results, communicating by private email, and scheduling acute appointments online when needed.  Please remember to go to the ER for any worsening symptoms such as worsening weakness, falls, fever, increased blood in the urine or increased confusion, or other pain  Please return on Monday for re-evaluation  - May 12

## 2012-07-30 NOTE — Assessment & Plan Note (Addendum)
Exam o/w intact - for head CT as above, labs as ordered, f/u as planned - ? Due to UTI  Note:  Total time for pt hx, exam, review of record with pt in the room, determination of diagnoses and plan for further eval and tx is > 40 min, with over 50% spent in coordination and counseling of patient
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
## 2012-07-30 NOTE — Assessment & Plan Note (Signed)
?   UTI- for antibx, urine cx, f/u in 3 days

## 2012-08-01 LAB — URINE CULTURE: Colony Count: 25000

## 2012-08-02 ENCOUNTER — Encounter: Payer: Self-pay | Admitting: Internal Medicine

## 2012-08-02 ENCOUNTER — Ambulatory Visit (INDEPENDENT_AMBULATORY_CARE_PROVIDER_SITE_OTHER): Payer: Medicare Other | Admitting: Internal Medicine

## 2012-08-02 VITALS — BP 102/70 | HR 85 | Temp 97.2°F | Wt 140.2 lb

## 2012-08-02 DIAGNOSIS — F411 Generalized anxiety disorder: Secondary | ICD-10-CM

## 2012-08-02 DIAGNOSIS — F05 Delirium due to known physiological condition: Secondary | ICD-10-CM

## 2012-08-02 DIAGNOSIS — I10 Essential (primary) hypertension: Secondary | ICD-10-CM

## 2012-08-02 NOTE — Patient Instructions (Addendum)
Please finish your current medications and treatment.   Please continue all other medications as before Please return for any worsening confusion, weakness, fever, pain or other symptoms such as worsening urinary symptoms Please return if memory seems to be worsening for MRI and possible neurology referral You may want to limit the exertion of traveling to [facility] as often as you have been doing

## 2012-08-02 NOTE — Assessment & Plan Note (Signed)
Etiology unclear, improved,  to f/u any worsening symptoms or concerns

## 2012-08-02 NOTE — Assessment & Plan Note (Signed)
With recent stressors and travel related to her confusion?   to f/u any worsening symptoms or concerns

## 2012-08-02 NOTE — Assessment & Plan Note (Signed)
stable overall by history and exam, recent data reviewed with pt, and pt to continue medical treatment as before,  to f/u any worsening symptoms or concerns BP Readings from Last 3 Encounters:  08/02/12 102/70  07/30/12 100/78  07/15/12 110/72

## 2012-08-02 NOTE — Progress Notes (Signed)
Subjective:    Patient ID: Julie Fritz, female    DOB: [birth year], 79 y.o.   MRN: [id number]  HPI  Here to f/u after seen last Friday with ? Hematuria, confusion and ? Of significant head trauma, tx with antibx but urine cx has returned neg, CT head without acute, and routine labs unrevealing.  Now thinks has prob hemorrhoidal blood. Confusion some improved today in that she is oriented to name, place, date (except Aug 01, 2012) and president, and can state the purpose of her meds and recognizes more of the names, doesn't recall the purpose of lisinopril..  Pt denies fever, wt loss, night sweats, loss of appetite, or other constitutional symptoms  No falls, worsening weakness. No pain, appetite ok, except for chronic recurring burning sensation of the left inguinal area, and pain to right costal margin to bend forward.  Husband agrees overall improved, back to her "ornery self" though states has noted some increase forgetfulness in past yr more than usual. HA resolved Past Medical History  Diagnosis Date  . FHx: migraine headaches   . ALLERGIC RHINITIS   . Hx of colonic polyps     hx of adenoma last 7/09  . Hyperlipidemia   . Osteoarthritis   . Peptic ulcer disease   . GERD (gastroesophageal reflux disease)   . Left knee DJD   . Right knee DJD   . Ileocecal valve syndrome   . IBS (irritable bowel syndrome)   . Anxiety   . Depression   . Lumbar spondylosis   . ALLERGIC RHINITIS 12/03/2006  . ANXIETY 05/31/2008  . CARPAL TUNNEL SYNDROME, BILATERAL 06/03/2007  . COLONIC POLYPS, HX OF 12/03/2006  . DEPRESSION 05/31/2008  . GERD 03/16/2007  . HYPERLIPIDEMIA 12/03/2006  . Irritable bowel syndrome 09/15/2007  . MIGRAINE HEADACHE 12/03/2006  . OSTEOARTHRITIS 12/03/2006  . PEPTIC ULCER DISEASE 12/03/2006  . Vitamin D deficiency 06/11/2010  . Cervical radicular pain 07/18/2010  . Hypertension   . HYPERTENSION 12/03/2006  . Hypothyroidism   . HYPOTHYROIDISM 12/03/2006  . Sinusitis   . OSTEOPOROSIS  01/2012    t score -2.7  Prolia 08/2011  . Insomnia 04/23/2011   Past Surgical History  Procedure Laterality Date  . Thyroidectomy      02/2004  . Tonsillectomy      1937  . Back surgery  1990    LUMBAR LAMINECTOMY  . Right hand surgery trigger surgury    . S/p right hand surgury/trigger surgury      Dr. Myerdierck  . Appendectomy      1979  . Abdominal hysterectomy      TAH  . Breast biopsy      1970    reports that she quit smoking about 8 years ago. Her smoking use included Cigarettes. She smoked 0.00 packs per day. She has quit using smokeless tobacco. She reports that she does not drink alcohol. Her drug history is not on file. family history includes Alzheimer's disease in her mother; Dementia in her mother; Hypertension in her mother; and Leukemia in her sister. Allergies  Allergen Reactions  . Tetracycline     "Lightheaded."  . Tramadol     Dizzy, GI upset   Current Outpatient Prescriptions on File Prior to Visit  Medication Sig Dispense Refill  . atorvastatin (LIPITOR) 10 MG tablet TAKE 1 TABLET EVERY DAY  30 tablet  10  . calcitRIOL (ROCALTROL) 0.25 MCG capsule Take 1 capsule (0.25 mcg total) by mouth daily.  90 capsule  3  .  cephALEXin (KEFLEX) 500 MG capsule Take 1 capsule (500 mg total) by mouth 4 (four) times daily.  40 capsule  0  . Cholecalciferol (VITAMIN D3) 50000 UNITS CAPS Take 1 capsule by mouth once a week. For 2 months  8 capsule  0  . lansoprazole (PREVACID) 30 MG capsule Take 1 capsule (30 mg total) by mouth daily.  90 capsule  2  . levothyroxine (SYNTHROID, LEVOTHROID) 88 MCG tablet Take 1 tablet (88 mcg total) by mouth daily.  90 tablet  3  . lisinopril (PRINIVIL,ZESTRIL) 5 MG tablet TAKE 1 TABLET BY MOUTH ONCE DAILY  90 tablet  3  . zolpidem (AMBIEN) 5 MG tablet TAKE 1 TABLET BY MOUTH AT BEDTIME AS NEEDED FOR SLEEP  30 tablet  5  . fluticasone (FLONASE) 50 MCG/ACT nasal spray Place 2 sprays into the nose daily.  16 g  2   Current  Facility-Administered Medications on File Prior to Visit  Medication Dose Route Frequency Provider Last Rate Last Dose  . denosumab (PROLIA) injection 60 mg  60 mg Subcutaneous Once Dara Lords, MD       Review of Systems  Constitutional: Negative for unexpected weight change, or unusual diaphoresis  HENT: Negative for tinnitus.   Eyes: Negative for photophobia and visual disturbance.  Respiratory: Negative for choking and stridor.   Gastrointestinal: Negative for vomiting and blood in stool.  Genitourinary: Negative for hematuria and decreased urine volume.  Musculoskeletal: Negative for acute joint swelling Skin: Negative for color change and wound.  Neurological: Negative for tremors and numbness other than noted  Psychiatric/Behavioral: Negative for decreased concentration or  hyperactivity.       Objective:   Physical Exam BP 102/70  Pulse 85  Temp(Src) 97.2 F (36.2 C) (Oral)  Wt 140 lb 4 oz (63.617 kg)  BMI 28.31 kg/m2  SpO2 99% VS noted,  Constitutional: Pt appears well-developed and well-nourished.  HENT: Head: NCAT.  Right Ear: External ear normal.  Left Ear: External ear normal.  Eyes: Conjunctivae and EOM are normal. Pupils are equal, round, and reactive to light.  Neck: Normal range of motion. Neck supple.  Cardiovascular: Normal rate and regular rhythm.   Pulmonary/Chest: Effort normal and breath sounds normal.  Abd:  Soft, NT, non-distended, + BS Neurological: Pt is alert. Not confused , motor intact Skin: Skin is warm. No erythema.  Psychiatric: Pt behavior is normal. 1+ nervous    Assessment & Plan:

## 2012-08-17 ENCOUNTER — Telehealth: Payer: Self-pay | Admitting: Internal Medicine

## 2012-08-17 NOTE — Telephone Encounter (Signed)
Ok to do every 6 mo, I did not know I had to order, as other patients get this without this  Let me know if I need to do anything else

## 2012-08-17 NOTE — Telephone Encounter (Signed)
Ruby will do and is aware

## 2012-08-17 NOTE — Telephone Encounter (Signed)
Pt came in for prolia injection, no injection has been order.

## 2012-08-27 ENCOUNTER — Ambulatory Visit (INDEPENDENT_AMBULATORY_CARE_PROVIDER_SITE_OTHER): Payer: Medicare Other

## 2012-08-27 DIAGNOSIS — M810 Age-related osteoporosis without current pathological fracture: Secondary | ICD-10-CM

## 2012-08-27 MED ORDER — DENOSUMAB 60 MG/ML ~~LOC~~ SOLN
60.0000 mg | Freq: Once | SUBCUTANEOUS | Status: AC
  Administered 2012-08-27: 60 mg via SUBCUTANEOUS

## 2012-09-25 ENCOUNTER — Other Ambulatory Visit: Payer: Self-pay | Admitting: Internal Medicine

## 2012-10-13 ENCOUNTER — Encounter: Payer: Self-pay | Admitting: Gastroenterology

## 2012-10-19 ENCOUNTER — Other Ambulatory Visit: Payer: Self-pay | Admitting: Internal Medicine

## 2012-10-19 NOTE — Telephone Encounter (Signed)
Faxed hardcopy to [facility] [place]  

## 2012-10-19 NOTE — Telephone Encounter (Signed)
Done hardcopy to robin  

## 2012-10-28 ENCOUNTER — Ambulatory Visit (INDEPENDENT_AMBULATORY_CARE_PROVIDER_SITE_OTHER)
Admission: RE | Admit: 2012-10-28 | Discharge: 2012-10-28 | Disposition: A | Discharge status: Home / Self Care | Payer: Medicare Other | Source: Ambulatory Visit | Attending: Internal Medicine | Admitting: Internal Medicine

## 2012-10-28 ENCOUNTER — Ambulatory Visit (INDEPENDENT_AMBULATORY_CARE_PROVIDER_SITE_OTHER): Payer: Medicare Other | Admitting: Internal Medicine

## 2012-10-28 ENCOUNTER — Encounter: Payer: Self-pay | Admitting: Internal Medicine

## 2012-10-28 VITALS — BP 104/78 | HR 85 | Temp 97.8°F | Wt 137.0 lb

## 2012-10-28 DIAGNOSIS — M25531 Pain in right wrist: Secondary | ICD-10-CM

## 2012-10-28 DIAGNOSIS — M25529 Pain in unspecified elbow: Secondary | ICD-10-CM

## 2012-10-28 DIAGNOSIS — M79609 Pain in unspecified limb: Secondary | ICD-10-CM

## 2012-10-28 DIAGNOSIS — M542 Cervicalgia: Secondary | ICD-10-CM | POA: Insufficient documentation

## 2012-10-28 DIAGNOSIS — M25521 Pain in right elbow: Secondary | ICD-10-CM

## 2012-10-28 DIAGNOSIS — M79641 Pain in right hand: Secondary | ICD-10-CM

## 2012-10-28 DIAGNOSIS — M25539 Pain in unspecified wrist: Secondary | ICD-10-CM

## 2012-10-28 NOTE — Assessment & Plan Note (Signed)
For film but suspect strain due to trauma, cont to monitor, should improve in next several days

## 2012-10-28 NOTE — Assessment & Plan Note (Signed)
R/o fx such as chip fx - for xray today

## 2012-10-28 NOTE — Assessment & Plan Note (Signed)
R/o wrist fx, for film today

## 2012-10-28 NOTE — Patient Instructions (Signed)
Please go to the XRAY Department in the Basement (go straight as you get off the elevator) for the x-ray testing Please continue all other medications as before, and refills have been done if requested. Please have the pharmacy call with any other refills you may need.  Please call if you feel you would like referral to Outpatient Physical Therapy

## 2012-10-28 NOTE — Assessment & Plan Note (Addendum)
R/o boxer fx - for film today,  to f/u any worsening symptoms or concerns  Note:  Total time for pt hx, exam, review of record with pt in the room, determination of diagnoses and plan for further eval and tx is > 40 min, with over 50% spent in coordination and counseling of patient

## 2012-10-28 NOTE — Progress Notes (Signed)
Subjective:    Patient ID: Julie Fritz, female    DOB: March 07, 1931, 79 y.o.   MRN: [id number]  HPI Here to f/u with husband, fell down 3 steps 3 days ago, hit right elbow and wrist primarily, has bruiseing pain to the right upper humeral area,, and minor abrasion to left mid ant leg already improved. Also  C/o pain to right neck, pulling sensation, worse to turn head to left, and some overall improved now about 5/10. Struck head after everything else mild but denies HA, confusion, n/v, vision change.  No clear radiacular pain.  Prior to fall has had some right sided facial pain.  No syncope, and Pt denies chest pain, increased sob or doe, wheezing, orthopnea, PND, increased LE swelling, palpitations, dizziness or syncope. Embarrassed over the fall, not directly witness by husbnad but he was there.  Pt declines need for PT. No siezure. Past Medical History  Diagnosis Date  . FHx: migraine headaches   . ALLERGIC RHINITIS   . Hx of colonic polyps     hx of adenoma last 7/09  . Hyperlipidemia   . Osteoarthritis   . Peptic ulcer disease   . GERD (gastroesophageal reflux disease)   . Left knee DJD   . Right knee DJD   . Ileocecal valve syndrome   . IBS (irritable bowel syndrome)   . Anxiety   . Depression   . Lumbar spondylosis   . ALLERGIC RHINITIS 12/03/2006  . ANXIETY 05/31/2008  . CARPAL TUNNEL SYNDROME, BILATERAL 06/03/2007  . COLONIC POLYPS, HX OF 12/03/2006  . DEPRESSION 05/31/2008  . GERD 03/16/2007  . HYPERLIPIDEMIA 12/03/2006  . Irritable bowel syndrome 09/15/2007  . MIGRAINE HEADACHE 12/03/2006  . OSTEOARTHRITIS 12/03/2006  . PEPTIC ULCER DISEASE 12/03/2006  . Vitamin D deficiency 06/11/2010  . Cervical radicular pain 07/18/2010  . Hypertension   . HYPERTENSION 12/03/2006  . Hypothyroidism   . HYPOTHYROIDISM 12/03/2006  . Sinusitis   . OSTEOPOROSIS 01/2012    t score -2.7  Prolia 08/2011  . Insomnia 04/23/2011   Past Surgical History  Procedure Laterality Date  . Thyroidectomy       02/2004  . Tonsillectomy      1937  . Back surgery  1990    LUMBAR LAMINECTOMY  . Right hand surgery trigger surgury    . S/p right hand surgury/trigger surgury      Dr. Myerdierck  . Appendectomy      1979  . Abdominal hysterectomy      TAH  . Breast biopsy      1970    reports that she quit smoking about 8 years ago. Her smoking use included Cigarettes. She smoked 0.00 packs per day. She has quit using smokeless tobacco. She reports that she does not drink alcohol. Her drug history is not on file. family history includes Alzheimer's disease in her mother; Dementia in her mother; Hypertension in her mother; and Leukemia in her sister. Allergies  Allergen Reactions  . Tetracycline     "Lightheaded."  . Tramadol     Dizzy, GI upset   Current Outpatient Prescriptions on File Prior to Visit  Medication Sig Dispense Refill  . atorvastatin (LIPITOR) 10 MG tablet TAKE 1 TABLET EVERY DAY  30 tablet  10  . calcitRIOL (ROCALTROL) 0.25 MCG capsule Take 1 capsule (0.25 mcg total) by mouth daily.  90 capsule  3  . cephALEXin (KEFLEX) 500 MG capsule Take 1 capsule (500 mg total) by mouth 4 (four) times daily.  40 capsule  0  . Cholecalciferol (VITAMIN D3) 50000 UNITS CAPS Take 1 capsule by mouth once a week. For 2 months  8 capsule  0  . citalopram (CELEXA) 10 MG tablet TAKE 1 TABLET BY MOUTH EVERY DAY  90 tablet  3  . cyclobenzaprine (FLEXERIL) 5 MG tablet TAKE 1 TABLET (5 MG TOTAL) BY MOUTH 3 (THREE) TIMES DAILY AS NEEDED FOR MUSCLE SPASMS.  60 tablet  3  . lansoprazole (PREVACID) 30 MG capsule Take 1 capsule (30 mg total) by mouth daily.  90 capsule  2  . levothyroxine (SYNTHROID, LEVOTHROID) 88 MCG tablet Take 1 tablet (88 mcg total) by mouth daily.  90 tablet  3  . lisinopril (PRINIVIL,ZESTRIL) 5 MG tablet TAKE 1 TABLET BY MOUTH ONCE DAILY  90 tablet  3  . zolpidem (AMBIEN) 5 MG tablet TAKE 1 TABLET BY MOUTH EVERY DAY AT BEDTIME  30 tablet  2  . fluticasone (FLONASE) 50 MCG/ACT nasal  spray Place 2 sprays into the nose daily.  16 g  2   Current Facility-Administered Medications on File Prior to Visit  Medication Dose Route Frequency Provider Last Rate Last Dose  . denosumab (PROLIA) injection 60 mg  60 mg Subcutaneous Once Dara Lords, MD        Review of Systems  Constitutional: Negative for unexpected weight change, or unusual diaphoresis  HENT: Negative for tinnitus.   Eyes: Negative for photophobia and visual disturbance.  Respiratory: Negative for choking and stridor.   Gastrointestinal: Negative for vomiting and blood in stool.  Genitourinary: Negative for hematuria and decreased urine volume.  Musculoskeletal: Negative for acute joint swelling Skin: Negative for color change and wound.  Neurological: Negative for tremors and numbness other than noted  Psychiatric/Behavioral: Negative for decreased concentration or  hyperactivity.       Objective:   Physical Exam BP 104/78  Pulse 85  Temp(Src) 97.8 F (36.6 C) (Oral)  Wt 137 lb (62.143 kg)  BMI 27.66 kg/m2  SpO2 91% VS noted, not ill but uncomfortable Constitutional: Pt appears well-developed and well-nourished.  HENT: Head: NCAT.  Right Ear: External ear normal.  Left Ear: External ear normal.  Eyes: Conjunctivae and EOM are normal. Pupils are equal, round, and reactive to light.  Neck: Normal range of motion Cardiovascular: Normal rate and regular rhythm.   Pulmonary/Chest: Effort normal and breath sounds normal.  Neurological: Pt is alert. Not confused but seems to me some cognitive slowing, ? More forgetful today than previous, formal MMSE not done, has mild LLE 4+/5 weakenss ? Due to pain Spine nontender Neck with FROM, mild tender to right paracervical area only, no swelling, rash Right shoulder FROM, NT, no bruising to upper arm or shoulder noted Right elbow with abrasion near lateral epicondylar area without cellulitis but mild swelling Right wrist with similar abrasion posteromed  aspect, no cellulitis but mild swelling, tender Right hand with large bruising/tender/mild swelling along the 5th metacarpal post hand Rue o/w neurovasc intact Skin: Skin is warm. No erythema. no LE edema Psychiatric: Pt behavior is normal. Thought content normal.     Assessment & Plan:

## 2012-11-25 ENCOUNTER — Ambulatory Visit (INDEPENDENT_AMBULATORY_CARE_PROVIDER_SITE_OTHER): Payer: Medicare Other

## 2012-11-25 DIAGNOSIS — Z23 Encounter for immunization: Secondary | ICD-10-CM

## 2013-01-18 ENCOUNTER — Ambulatory Visit: Payer: Medicare Other | Admitting: Internal Medicine

## 2013-01-26 ENCOUNTER — Ambulatory Visit: Payer: Medicare Other | Admitting: Internal Medicine

## 2013-02-01 ENCOUNTER — Encounter: Payer: Self-pay | Admitting: Internal Medicine

## 2013-02-01 ENCOUNTER — Other Ambulatory Visit (INDEPENDENT_AMBULATORY_CARE_PROVIDER_SITE_OTHER): Payer: Medicare Other

## 2013-02-01 ENCOUNTER — Ambulatory Visit (INDEPENDENT_AMBULATORY_CARE_PROVIDER_SITE_OTHER): Payer: Medicare Other | Admitting: Internal Medicine

## 2013-02-01 VITALS — BP 112/82 | HR 87 | Temp 98.1°F | Wt 138.1 lb

## 2013-02-01 DIAGNOSIS — R413 Other amnesia: Secondary | ICD-10-CM

## 2013-02-01 DIAGNOSIS — F329 Major depressive disorder, single episode, unspecified: Secondary | ICD-10-CM

## 2013-02-01 DIAGNOSIS — E785 Hyperlipidemia, unspecified: Secondary | ICD-10-CM

## 2013-02-01 DIAGNOSIS — E039 Hypothyroidism, unspecified: Secondary | ICD-10-CM

## 2013-02-01 DIAGNOSIS — I10 Essential (primary) hypertension: Secondary | ICD-10-CM

## 2013-02-01 LAB — URINALYSIS, ROUTINE W REFLEX MICROSCOPIC
Nitrite: NEGATIVE
Total Protein, Urine: NEGATIVE
pH: 6 (ref 5.0–8.0)

## 2013-02-01 LAB — CBC WITH DIFFERENTIAL/PLATELET
Basophils Relative: 0.6 % (ref 0.0–3.0)
Eosinophils Relative: 3.2 % (ref 0.0–5.0)
Lymphocytes Relative: 32.2 % (ref 12.0–46.0)
Monocytes Relative: 7.7 % (ref 3.0–12.0)
Neutrophils Relative %: 56.3 % (ref 43.0–77.0)
RBC: 4.94 Mil/uL (ref 3.87–5.11)
WBC: 7.7 10*3/uL (ref 4.5–10.5)

## 2013-02-01 LAB — LIPID PANEL
Cholesterol: 258 mg/dL — ABNORMAL HIGH (ref 0–200)
VLDL: 40.8 mg/dL — ABNORMAL HIGH (ref 0.0–40.0)

## 2013-02-01 LAB — HEPATIC FUNCTION PANEL
ALT: 19 U/L (ref 0–35)
Albumin: 3.9 g/dL (ref 3.5–5.2)
Total Bilirubin: 0.5 mg/dL (ref 0.3–1.2)
Total Protein: 7.4 g/dL (ref 6.0–8.3)

## 2013-02-01 LAB — BASIC METABOLIC PANEL
BUN: 21 mg/dL (ref 6–23)
Chloride: 104 mEq/L (ref 96–112)
Creatinine, Ser: 1.1 mg/dL (ref 0.4–1.2)

## 2013-02-01 LAB — LDL CHOLESTEROL, DIRECT: Direct LDL: 178.1 mg/dL

## 2013-02-01 MED ORDER — LEVOTHYROXINE SODIUM 88 MCG PO TABS: 88.0000 ug | ORAL_TABLET | Freq: Every day | ORAL | Status: DC

## 2013-02-01 MED ORDER — DONEPEZIL HCL 5 MG PO TABS: 5.0000 mg | ORAL_TABLET | Freq: Every day | ORAL | Status: DC

## 2013-02-01 NOTE — Progress Notes (Signed)
Subjective:    Patient ID: Julie Fritz, female    DOB: [birth year], 79 y.o.   MRN: [id number]  HPI  Here with 1 mo onset noticeably to family with confusion, memory loss (more short term, long term ok), irritability, probable less compliance with taking meds though daugher and granddaughter has been trying to get her to take at least 4 meds - lipitor, calcitriol, thyroid med, and ACEI.  Tends to wander in conversation, no wandering out of the home, forgets where she is sometimes, and c/o fatigue, though can still do her ADL's and still helps take care of husband.  Cant understand her meds, not clear if taking.  Did fall down 2 steps 2 mo ago, no head trauma, no other falls.  Does take longer to toilet recent , but Denies urinary symptoms such as dysuria, frequency, urgency, flank pain, hematuria or n/v, fever, chills. Pt denies chest pain, increased sob or doe, wheezing, orthopnea, PND, increased LE swelling, palpitations, dizziness or syncope.   Pt denies fever, wt loss, night sweats, loss of appetite, or other constitutional symptoms - appetitie good per husband.  She and husband not wanting mult tests or neuro referral.  Mother with hx of Alzhemer's.   CT head may 2014 with atrophy Past Medical History  Diagnosis Date  . FHx: migraine headaches   . ALLERGIC RHINITIS   . Hx of colonic polyps     hx of adenoma last 7/09  . Hyperlipidemia   . Osteoarthritis   . Peptic ulcer disease   . GERD (gastroesophageal reflux disease)   . Left knee DJD   . Right knee DJD   . Ileocecal valve syndrome   . IBS (irritable bowel syndrome)   . Anxiety   . Depression   . Lumbar spondylosis   . ALLERGIC RHINITIS 12/03/2006  . ANXIETY 05/31/2008  . CARPAL TUNNEL SYNDROME, BILATERAL 06/03/2007  . COLONIC POLYPS, HX OF 12/03/2006  . DEPRESSION 05/31/2008  . GERD 03/16/2007  . HYPERLIPIDEMIA 12/03/2006  . Irritable bowel syndrome 09/15/2007  . MIGRAINE HEADACHE 12/03/2006  . OSTEOARTHRITIS 12/03/2006  . PEPTIC  ULCER DISEASE 12/03/2006  . Vitamin D deficiency 06/11/2010  . Cervical radicular pain 07/18/2010  . Hypertension   . HYPERTENSION 12/03/2006  . Hypothyroidism   . HYPOTHYROIDISM 12/03/2006  . Sinusitis   . OSTEOPOROSIS 01/2012    t score -2.7  Prolia 08/2011  . Insomnia 04/23/2011   Past Surgical History  Procedure Laterality Date  . Thyroidectomy      02/2004  . Tonsillectomy      1937  . Back surgery  1990    LUMBAR LAMINECTOMY  . Right hand surgery trigger surgury    . S/p right hand surgury/trigger surgury      Dr. Myerdierck  . Appendectomy      1979  . Abdominal hysterectomy      TAH  . Breast biopsy      1970    reports that she quit smoking about 8 years ago. Her smoking use included Cigarettes. She smoked 0.00 packs per day. She has quit using smokeless tobacco. She reports that she does not drink alcohol. Her drug history is not on file. family history includes Alzheimer's disease in her mother; Dementia in her mother; Hypertension in her mother; Leukemia in her sister. Allergies  Allergen Reactions  . Tetracycline     "Lightheaded."  . Tramadol     Dizzy, GI upset   Current Outpatient Prescriptions on File Prior to Visit  Medication Sig Dispense Refill  . atorvastatin (LIPITOR) 10 MG tablet TAKE 1 TABLET EVERY DAY  30 tablet  10  . calcitRIOL (ROCALTROL) 0.25 MCG capsule Take 1 capsule (0.25 mcg total) by mouth daily.  90 capsule  3  . Cholecalciferol (VITAMIN D3) 50000 UNITS CAPS Take 1 capsule by mouth once a week. For 2 months  8 capsule  0  . citalopram (CELEXA) 10 MG tablet TAKE 1 TABLET BY MOUTH EVERY DAY  90 tablet  3  . cyclobenzaprine (FLEXERIL) 5 MG tablet TAKE 1 TABLET (5 MG TOTAL) BY MOUTH 3 (THREE) TIMES DAILY AS NEEDED FOR MUSCLE SPASMS.  60 tablet  3  . lansoprazole (PREVACID) 30 MG capsule Take 1 capsule (30 mg total) by mouth daily.  90 capsule  2  . lisinopril (PRINIVIL,ZESTRIL) 5 MG tablet TAKE 1 TABLET BY MOUTH ONCE DAILY  90 tablet  3  .  zolpidem (AMBIEN) 5 MG tablet TAKE 1 TABLET BY MOUTH EVERY DAY AT BEDTIME  30 tablet  2  . fluticasone (FLONASE) 50 MCG/ACT nasal spray Place 2 sprays into the nose daily.  16 g  2   Current Facility-Administered Medications on File Prior to Visit  Medication Dose Route Frequency Provider Last Rate Last Dose  . denosumab (PROLIA) injection 60 mg  60 mg Subcutaneous Once Dara Lords, MD       Review of Systems  Constitutional: Negative for unexpected weight change, or unusual diaphoresis  HENT: Negative for tinnitus.   Eyes: Negative for photophobia and visual disturbance.  Respiratory: Negative for choking and stridor.   Gastrointestinal: Negative for vomiting and blood in stool.  Genitourinary: Negative for hematuria and decreased urine volume.  Musculoskeletal: Negative for acute joint swelling Skin: Negative for color change and wound.  Neurological: Negative for tremors and numbness other than noted  Psychiatric/Behavioral: Negative for decreased concentration or  hyperactivity.       Objective:   Physical Exam BP 112/82  Pulse 87  Temp(Src) 98.1 F (36.7 C) (Oral)  Wt 138 lb 2 oz (62.653 kg)  SpO2 98% VS noted,  Constitutional: Pt appears well-developed and well-nourished.  HENT: Head: NCAT.  Right Ear: External ear normal.  Left Ear: External ear normal.  Eyes: Conjunctivae and EOM are normal. Pupils are equal, round, and reactive to light.  Neck: Normal range of motion. Neck supple.  Cardiovascular: Normal rate and regular rhythm.   Pulmonary/Chest: Effort normal and breath sounds normal.  Abd:  Soft, NT, non-distended, + BS Neurological: Pt is alert. Motor 5/5, gait ok, cn 2-12 intact Skin: Skin is warm. No erythema.  Psychiatric: Pt behavior is normal. Thought content c/w ST memory loss, mild confusion    Assessment & Plan:

## 2013-02-01 NOTE — Patient Instructions (Signed)
OK to stop the flexeril and ambien Please take all new medication as prescribed - the Donepezil 5 mg per day You can also take a B complex Multivitamin OTC - 1 per day which may help as well  Please continue all other medications as before, and refills have been done if requested. Please have the pharmacy call with any other refills you may need.  You will be contacted regarding the referral for: MRI - to see the [facility] today Please go to the LAB in the Basement (turn left off the elevator) for the tests to be done today You will be contacted by phone if any changes need to be made immediately.  Otherwise, you will receive a letter about your results with an explanation, but please check with MyChart first.  Please remember to sign up for My Chart if you have not done so, as this will be important to you in the future with finding out test results, communicating by private email, and scheduling acute appointments online when needed.  Please return next month as you have planned

## 2013-02-02 DIAGNOSIS — R413 Other amnesia: Secondary | ICD-10-CM | POA: Insufficient documentation

## 2013-02-02 NOTE — Assessment & Plan Note (Addendum)
And confusion, acute onset x 1 mo per husband, doubt med s/e related but d/c flexeril/ambien, hemodyam stable, neuro stable, afeb and at least no overt symptoms of infectiuos etiology, for labs today and MRI as can completely r/o fall with head strike - r/o SDH; ok for donepezil low dose  \ Note:  Total time for pt hx, exam, review of record with pt in the room, determination of diagnoses and plan for further eval and tx is > 40 min, with over 50% spent in coordination and counseling of patient

## 2013-02-02 NOTE — Assessment & Plan Note (Signed)
stable overall by history and exam, recent data reviewed with pt, and pt to continue medical treatment as before,  to f/u any worsening symptoms or concerns BP Readings from Last 3 Encounters:  02/01/13 112/82  10/28/12 104/78  08/02/12 102/70

## 2013-02-02 NOTE — Assessment & Plan Note (Addendum)
Doubt cause of current confusion though cant completely r/o,  to f/u any worsening symptoms or concerns, consider psychiatry

## 2013-02-02 NOTE — Assessment & Plan Note (Signed)
Per written family notes brought today, likely not taking her thyroid med recently, for TSH, advised to take all meds as prescribed, f/u next visit (has appt next mo)

## 2013-02-08 ENCOUNTER — Ambulatory Visit
Admission: RE | Admit: 2013-02-08 | Discharge: 2013-02-08 | Disposition: A | Discharge status: Home / Self Care | Payer: Medicare Other | Source: Ambulatory Visit | Attending: Internal Medicine | Admitting: Internal Medicine

## 2013-02-08 DIAGNOSIS — R413 Other amnesia: Secondary | ICD-10-CM

## 2013-03-01 ENCOUNTER — Ambulatory Visit: Payer: Medicare Other | Admitting: Internal Medicine

## 2013-03-15 ENCOUNTER — Encounter: Payer: Self-pay | Admitting: Internal Medicine

## 2013-03-15 ENCOUNTER — Other Ambulatory Visit (INDEPENDENT_AMBULATORY_CARE_PROVIDER_SITE_OTHER): Payer: Medicare Other

## 2013-03-15 ENCOUNTER — Ambulatory Visit (INDEPENDENT_AMBULATORY_CARE_PROVIDER_SITE_OTHER): Payer: Medicare Other | Admitting: Internal Medicine

## 2013-03-15 VITALS — BP 110/87 | HR 89 | Temp 97.1°F | Wt 134.1 lb

## 2013-03-15 DIAGNOSIS — E785 Hyperlipidemia, unspecified: Secondary | ICD-10-CM

## 2013-03-15 DIAGNOSIS — R251 Tremor, unspecified: Secondary | ICD-10-CM

## 2013-03-15 DIAGNOSIS — R413 Other amnesia: Secondary | ICD-10-CM

## 2013-03-15 DIAGNOSIS — R2689 Other abnormalities of gait and mobility: Secondary | ICD-10-CM

## 2013-03-15 DIAGNOSIS — R259 Unspecified abnormal involuntary movements: Secondary | ICD-10-CM

## 2013-03-15 DIAGNOSIS — R269 Unspecified abnormalities of gait and mobility: Secondary | ICD-10-CM

## 2013-03-15 DIAGNOSIS — I10 Essential (primary) hypertension: Secondary | ICD-10-CM

## 2013-03-15 LAB — HEPATIC FUNCTION PANEL
AST: 23 U/L (ref 0–37)
Total Bilirubin: 0.5 mg/dL (ref 0.3–1.2)

## 2013-03-15 LAB — BASIC METABOLIC PANEL
BUN: 23 mg/dL (ref 6–23)
Calcium: 9.8 mg/dL (ref 8.4–10.5)
GFR: 44.17 mL/min — ABNORMAL LOW (ref >60.00)
Potassium: 3.8 mEq/L (ref 3.5–5.1)
Sodium: 140 mEq/L (ref 135–145)

## 2013-03-15 LAB — LIPID PANEL
HDL: 47 mg/dL (ref >39.00)
Triglycerides: 204 mg/dL — ABNORMAL HIGH (ref 0.0–149.0)
VLDL: 40.8 mg/dL — ABNORMAL HIGH (ref 0.0–40.0)

## 2013-03-15 LAB — TSH: TSH: 2.59 u[IU]/mL (ref 0.35–5.50)

## 2013-03-15 MED ORDER — DONEPEZIL HCL 5 MG PO TABS: 5.0000 mg | ORAL_TABLET | Freq: Every day | ORAL | Status: DC

## 2013-03-15 MED ORDER — LANSOPRAZOLE 30 MG PO CPDR: 30.0000 mg | DELAYED_RELEASE_CAPSULE | Freq: Every day | ORAL | Status: AC

## 2013-03-15 MED ORDER — LEVOTHYROXINE SODIUM 88 MCG PO TABS: 88.0000 ug | ORAL_TABLET | Freq: Every day | ORAL | Status: DC

## 2013-03-15 MED ORDER — CITALOPRAM HYDROBROMIDE 10 MG PO TABS: 10.0000 mg | ORAL_TABLET | Freq: Every day | ORAL | Status: DC

## 2013-03-15 MED ORDER — LISINOPRIL 5 MG PO TABS: 5.0000 mg | ORAL_TABLET | Freq: Every day | ORAL | Status: AC

## 2013-03-15 MED ORDER — ATORVASTATIN CALCIUM 10 MG PO TABS: 10.0000 mg | ORAL_TABLET | Freq: Every day | ORAL | Status: AC

## 2013-03-15 NOTE — Patient Instructions (Addendum)
Please continue all other medications as before, and refills have been done if requested. Please have the pharmacy call with any other refills you may need. Please go to the LAB in the Basement (turn left off the elevator) for the tests to be done today You will be contacted by phone if any changes need to be made immediately.  Otherwise, you will receive a letter about your results with an explanation, but please check with MyChart first.  Please remember to sign up for My Chart if you have not done so, as this will be important to you in the future with finding out test results, communicating by private email, and scheduling acute appointments online when needed.  Please return in 6 months, or sooner if needed  You will be contacted regarding the referral for: neurology

## 2013-03-15 NOTE — Assessment & Plan Note (Signed)
stable overall by history and exam, recent data reviewed with pt, and pt to continue medical treatment as before,  to f/u any worsening symptoms or concerns Lab Results  Component Value Date   LDLCALC 100* 07/16/2012   For lab f/u today

## 2013-03-15 NOTE — Assessment & Plan Note (Signed)
stable overall by history and exam, recent data reviewed with pt, and pt to continue medical treatment as before,  to f/u any worsening symptoms or concerns BP Readings from Last 3 Encounters:  03/15/13 110/87  02/01/13 112/82  10/28/12 104/78

## 2013-03-15 NOTE — Progress Notes (Signed)
Pre-visit discussion using our clinic review tool. No additional management support is needed unless otherwise documented below in the visit note.  

## 2013-03-15 NOTE — Assessment & Plan Note (Signed)
?   Early dementia, family reqeusts neuro consult

## 2013-03-15 NOTE — Progress Notes (Signed)
Subjective:    Patient ID: Julie Fritz, female    DOB: 14-Mar-1931, 79 y.o.   MRN: [id number]  HPI here to f/u, mentions some increase in 6 mo of worsening memory issue, shuffling gait and tremor but without balance issue or falls.  Pt denies chest pain, increased sob or doe, wheezing, orthopnea, PND, increased LE swelling, palpitations, dizziness or syncope.   Pt denies polydipsia, polyuria.  Pt denies new neurological symptoms such as new headache, or facial or extremity weakness or numbness.  Family ensures she is now taking her meds Past Medical History  Diagnosis Date  . FHx: migraine headaches   . ALLERGIC RHINITIS   . Hx of colonic polyps     hx of adenoma last 7/09  . Hyperlipidemia   . Osteoarthritis   . Peptic ulcer disease   . GERD (gastroesophageal reflux disease)   . Left knee DJD   . Right knee DJD   . Ileocecal valve syndrome   . IBS (irritable bowel syndrome)   . Anxiety   . Depression   . Lumbar spondylosis   . ALLERGIC RHINITIS 12/03/2006  . ANXIETY 05/31/2008  . CARPAL TUNNEL SYNDROME, BILATERAL 06/03/2007  . COLONIC POLYPS, HX OF 12/03/2006  . DEPRESSION 05/31/2008  . GERD 03/16/2007  . HYPERLIPIDEMIA 12/03/2006  . Irritable bowel syndrome 09/15/2007  . MIGRAINE HEADACHE 12/03/2006  . OSTEOARTHRITIS 12/03/2006  . PEPTIC ULCER DISEASE 12/03/2006  . Vitamin D deficiency 06/11/2010  . Cervical radicular pain 07/18/2010  . Hypertension   . HYPERTENSION 12/03/2006  . Hypothyroidism   . HYPOTHYROIDISM 12/03/2006  . Sinusitis   . OSTEOPOROSIS 01/2012    t score -2.7  Prolia 08/2011  . Insomnia 04/23/2011   Past Surgical History  Procedure Laterality Date  . Thyroidectomy      02/2004  . Tonsillectomy      1937  . Back surgery  1990    LUMBAR LAMINECTOMY  . Right hand surgery trigger surgury    . S/p right hand surgury/trigger surgury      Dr. Myerdierck  . Appendectomy      1979  . Abdominal hysterectomy      TAH  . Breast biopsy      1970    reports  that she quit smoking about 8 years ago. Her smoking use included Cigarettes. She smoked 0.00 packs per day. She has quit using smokeless tobacco. She reports that she does not drink alcohol. Her drug history is not on file. family history includes Alzheimer's disease in her mother; Dementia in her mother; Hypertension in her mother; Leukemia in her sister. Allergies  Allergen Reactions  . Tetracycline     "Lightheaded."  . Tramadol     Dizzy, GI upset   Current Outpatient Prescriptions on File Prior to Visit  Medication Sig Dispense Refill  . calcitRIOL (ROCALTROL) 0.25 MCG capsule Take 1 capsule (0.25 mcg total) by mouth daily.  90 capsule  3  . Cholecalciferol (VITAMIN D3) 50000 UNITS CAPS Take 1 capsule by mouth once a week. For 2 months  8 capsule  0  . fluticasone (FLONASE) 50 MCG/ACT nasal spray Place 2 sprays into the nose daily.  16 g  2   Current Facility-Administered Medications on File Prior to Visit  Medication Dose Route Frequency Provider Last Rate Last Dose  . denosumab (PROLIA) injection 60 mg  60 mg Subcutaneous Once Dara Lords, MD       Review of Systems  Constitutional: Negative for  unexpected weight change, or unusual diaphoresis  HENT: Negative for tinnitus.   Eyes: Negative for photophobia and visual disturbance.  Respiratory: Negative for choking and stridor.   Gastrointestinal: Negative for vomiting and blood in stool.  Genitourinary: Negative for hematuria and decreased urine volume.  Musculoskeletal: Negative for acute joint swelling Skin: Negative for color change and wound.  Neurological: Negative numbness other than noted  Psychiatric/Behavioral: Negative for decreased concentration or  hyperactivity.       Objective:   Physical Exam BP 110/87  Pulse 89  Temp(Src) 97.1 F (36.2 C) (Oral)  Wt 134 lb 2 oz (60.839 kg)  SpO2 97% VS noted,  Constitutional: Pt appears well-developed and well-nourished.  HENT: Head: NCAT.  Right Ear: External  ear normal.  Left Ear: External ear normal.  Eyes: Conjunctivae and EOM are normal. Pupils are equal, round, and reactive to light.  Neck: Normal range of motion. Neck supple.  Cardiovascular: Normal rate and regular rhythm.   Pulmonary/Chest: Effort normal and breath sounds normal.  Abd:  Soft, NT, non-distended, + BS Neurological: Pt is alert. Remains mild confused , small bilat hand tremor Skin: Skin is warm. No erythema.  Psychiatric: Pt behavior is normal.     Assessment & Plan:

## 2013-03-15 NOTE — Assessment & Plan Note (Signed)
?  orthopedic vs neuro - for neuro referral, r/o parkinsons with tremor as well

## 2013-04-18 ENCOUNTER — Encounter: Payer: Self-pay | Admitting: Neurology

## 2013-04-18 ENCOUNTER — Ambulatory Visit (INDEPENDENT_AMBULATORY_CARE_PROVIDER_SITE_OTHER): Payer: Medicare Other | Admitting: Neurology

## 2013-04-18 ENCOUNTER — Encounter: Payer: Self-pay | Admitting: Gynecology

## 2013-04-18 ENCOUNTER — Telehealth: Payer: Self-pay | Admitting: Neurology

## 2013-04-18 ENCOUNTER — Ambulatory Visit (INDEPENDENT_AMBULATORY_CARE_PROVIDER_SITE_OTHER): Payer: Medicare Other | Admitting: Gynecology

## 2013-04-18 VITALS — BP 110/68 | HR 64 | Temp 97.1°F | Ht 59.0 in | Wt 134.0 lb

## 2013-04-18 VITALS — BP 120/76 | Ht <= 58 in | Wt 135.0 lb

## 2013-04-18 DIAGNOSIS — N952 Postmenopausal atrophic vaginitis: Secondary | ICD-10-CM

## 2013-04-18 DIAGNOSIS — G3109 Other frontotemporal dementia: Secondary | ICD-10-CM

## 2013-04-18 DIAGNOSIS — M810 Age-related osteoporosis without current pathological fracture: Secondary | ICD-10-CM

## 2013-04-18 DIAGNOSIS — G20 Parkinson's disease: Secondary | ICD-10-CM

## 2013-04-18 DIAGNOSIS — F0280 Dementia in other diseases classified elsewhere without behavioral disturbance: Secondary | ICD-10-CM

## 2013-04-18 NOTE — Patient Instructions (Signed)
Follow up in one year for annual exam 

## 2013-04-18 NOTE — Progress Notes (Signed)
Julie Fritz was seen today in the movement disorders clinic for neurologic consultation at the request of Cathlean Cower, MD.  The consultation is for the evaluation of tremor, shuffling gait and memory loss.  This patient is accompanied in the office by her spouse who supplements the history.  The pt reports that 2 years ago, she was at her daughters house (really her biological granddaughter but they raised her and call her a daughter) and fell going into her daughters sunken living room and ended up in the hospital for 4 weeks.  She did not fx anything but hasn't driven since then.  She states that, following that, her daughter "became a doctor" and decided she had PD and was sent her to r/o PD.    Specific Symptoms:  Tremor: yes, bilateral UE per husband (more with activation per husband and more with holding things but some at rest) Voice: no changes Sleep: lives with daughter and her family and her daughter has 6 children so sleep is intermittent  Vivid Dreams:  no  Acting out dreams:  no Wet Pillows: no Postural symptoms:  yes  Falls?  yes (last fall 5 months ago, moving into daughters home and tripped over feet; fell 4 different instances while moving in) Bradykinesia symptoms: slow movements and difficulty getting out of a chair Loss of smell:  no Loss of taste:  yes Urinary Incontinence:  yes Difficulty Swallowing:  no Handwriting, micrographia: yes Trouble with ADL's:  no  Trouble buttoning clothing: no Depression:  yes (living with daughter and financially overwhelming and children living in home have lots of medical problems - deafness/downs syndrome; aspergers; obesity) Memory changes:  yes (good long term but "spotty" short term; doesn't cook any longer as daughter won't let her; Aricept on list but they deny that she is on this and are adament and somewhat aggressive when discussing this) Hallucinations:  no  visual distortions: no N/V:  no Lightheaded:  no  Syncope:  no Diplopia:  no Dyskinesia:  no  PREVIOUS MEDICATIONS: none to date  ALLERGIES:   Allergies  Allergen Reactions  . Tetracycline     "Lightheaded."  . Tramadol     Dizzy, GI upset    CURRENT MEDICATIONS:  Current Outpatient Prescriptions on File Prior to Visit  Medication Sig Dispense Refill  . atorvastatin (LIPITOR) 10 MG tablet Take 1 tablet (10 mg total) by mouth daily.  30 tablet  11  . calcitRIOL (ROCALTROL) 0.25 MCG capsule Take 1 capsule (0.25 mcg total) by mouth daily.  90 capsule  3  . Cholecalciferol (VITAMIN D3) 50000 UNITS CAPS Take 1 capsule by mouth once a week. For 2 months  8 capsule  0  . citalopram (CELEXA) 10 MG tablet Take 1 tablet (10 mg total) by mouth daily.  30 tablet  11  . donepezil (ARICEPT) 5 MG tablet Take 1 tablet (5 mg total) by mouth daily.  30 tablet  11  . lansoprazole (PREVACID) 30 MG capsule Take 1 capsule (30 mg total) by mouth daily.  30 capsule  11  . levothyroxine (SYNTHROID, LEVOTHROID) 88 MCG tablet Take 1 tablet (88 mcg total) by mouth daily.  30 tablet  11  . lisinopril (PRINIVIL,ZESTRIL) 5 MG tablet Take 1 tablet (5 mg total) by mouth daily.  30 tablet  11  . fluticasone (FLONASE) 50 MCG/ACT nasal spray Place 2 sprays into the nose daily.  16 g  2   Current Facility-Administered Medications on File Prior to  Visit  Medication Dose Route Frequency Provider Last Rate Last Dose  . denosumab (PROLIA) injection 60 mg  60 mg Subcutaneous Once Dara Lordsimothy P Fontaine, MD        PAST MEDICAL HISTORY:   Past Medical History  Diagnosis Date  . FHx: migraine headaches   . ALLERGIC RHINITIS   . Hx of colonic polyps     hx of adenoma last 7/09  . Hyperlipidemia   . Osteoarthritis   . Peptic ulcer disease   . GERD (gastroesophageal reflux disease)   . Left knee DJD   . Right knee DJD   . Ileocecal valve syndrome   . IBS (irritable bowel syndrome)   . Anxiety   . Depression   . Lumbar spondylosis   . ALLERGIC RHINITIS 12/03/2006  . ANXIETY  05/31/2008  . CARPAL TUNNEL SYNDROME, BILATERAL 06/03/2007  . COLONIC POLYPS, HX OF 12/03/2006  . DEPRESSION 05/31/2008  . GERD 03/16/2007  . HYPERLIPIDEMIA 12/03/2006  . Irritable bowel syndrome 09/15/2007  . MIGRAINE HEADACHE 12/03/2006  . OSTEOARTHRITIS 12/03/2006  . PEPTIC ULCER DISEASE 12/03/2006  . Vitamin D deficiency 06/11/2010  . Cervical radicular pain 07/18/2010  . Hypertension   . HYPERTENSION 12/03/2006  . Hypothyroidism   . HYPOTHYROIDISM 12/03/2006  . Sinusitis   . OSTEOPOROSIS 01/2012    t score -2.7  Prolia 08/2011  . Insomnia 04/23/2011    PAST SURGICAL HISTORY:   Past Surgical History  Procedure Laterality Date  . Thyroidectomy      02/2004  . Tonsillectomy      1937  . Back surgery  1990    LUMBAR LAMINECTOMY  . Right hand surgery trigger surgury    . S/p right hand surgury/trigger surgury      Dr. Myerdierck  . Appendectomy      1979  . Abdominal hysterectomy      TAH  . Breast biopsy      1970    SOCIAL HISTORY:   History   Social History  . Marital Status: Married    Spouse Name: N/A    Number of Children: N/A  . Years of Education: N/A   Occupational History  . retired     [employer] de[employer]   Social History Main Topics  . Smoking status: Former Smoker    Types: Cigarettes    Quit date: 03/24/2004  . Smokeless tobacco: Former NeurosurgeonUser  . Alcohol Use: No  . Drug Use: Not on file  . Sexual Activity: Yes    Birth Control/ Protection: Surgical   Other Topics Concern  . Not on file   Social History Narrative  . No narrative on file    FAMILY HISTORY:   Family Status  Relation Status Death Age  . Mother Deceased     AD  . Father Deceased     unknown cause of death  . Sister Deceased     [place], leukemia  . Child Alive     2 adopted, and raised a grandchild    ROS:  A complete 10 system review of systems was obtained and was unremarkable apart from what is mentioned above.  PHYSICAL EXAMINATION:    VITALS:   Filed Vitals:    04/18/13 0956  BP: 110/68  Pulse: 64  Temp: 97.1 F (36.2 C)  TempSrc: Oral  Height: 4\' 11"  (1.499 m)  Weight: 134 lb (60.782 kg)    GEN:  The patient appears stated age and is in NAD. HEENT:  Normocephalic, atraumatic.  The mucous membranes are moist. The superficial temporal arteries are without ropiness or tenderness. CV:  RRR Lungs:  CTAB Neck/HEME:  There are no carotid bruits bilaterally.  Neurological examination:  Orientation: Pt scored 23/30 on her MMSE.  Able to name year, season, day of week and month.  Easily tearful with pseudobulbar affect/crying at times.  She is somewhat apraxic when asked to perform motor and commands of coordination. Cranial nerves: There is good facial symmetry. Pupils are equal round and reactive to light bilaterally. Fundoscopic exam is attempted but the disc margins are not well visualized bilaterally.. Extraocular muscles are intact. The visual fields are full to confrontational testing. The speech is fluent and clear. Soft palate rises symmetrically and there is no tongue deviation. Hearing is intact to conversational tone. Sensation: Sensation is intact to light and pinprick throughout (facial, trunk, extremities). Vibration is intact at the bilateral big toe. There is no extinction with double simultaneous stimulation. There is no sensory dermatomal level identified. Motor: Strength is 5/5 in the bilateral upper and lower extremities.   Shoulder shrug is equal and symmetric.  There is no pronator drift. Deep tendon reflexes: Deep tendon reflexes are 2/4 at the bilateral biceps, triceps, brachioradialis, patella and trace at the bilateral achilles. Plantar responses are downgoing bilaterally.  Movement examination: Tone: There is normal tone in the bilateral upper extremities.  The tone in the lower extremities is normal.  Abnormal movements: There is a bilateral UE resting tremor, R greater than L but there is also an action tremor  bilaterally Coordination:  There is no decremation with RAM's Gait and Station: The patient has no difficulty arising out of a deep-seated chair without the use of the hands. The patient's stride length is just minimally decreased.  She drags the feet slightly.  Arm swing is good.  Turns are good.    ASSESSMENT/PLAN:  1.  Memory changes, tremor and gait instability  -I think that the pt likely has FTD.  She does have some pseudobulbar affect (crying).  I think that hx was somewhat difficult as pt/husband were somewhat defensive when it came to discussing memory and no one else was present to supplement hx.  I did not see any evidence of PD.  I thought she was on aricept but they did not think that she was.  If she is not, it would be my recommendation that she start it.  If she is only on the 5 mg, it would be my recommendation that she increase to 10 mg. In the end, they decided that they would discuss these recommendations with Dr. Jenny Reichmann.  -Equally as important, if not more importantly, it is my recommendation that the patient begin some therapy.  They were agreeable to letting the go ahead and set balance therapy up.  I think she would benefit from this at least every 6 months.  I discussed this with them today.  -safety in the home was discussed.  Greater than 50% of the visit was spent in counseling in this regard.  It sounds like they already have most of these measures in place.  She is not cooking, watching her children alone or driving.  She is not administering her medications.  -She will f/u prn.  She is going to discuss above recommendations with Dr. Jenny Reichmann (as they felt most comfortable with this).  Face to face time 60 min

## 2013-04-18 NOTE — Telephone Encounter (Signed)
Home health referral for physical therapy/ balance therapy sent to [facility] at [phone] with confirmation received. They will contact patient to set up therapy.

## 2013-04-18 NOTE — Patient Instructions (Signed)
[employer]  Medical Alert Systems that can locate patients outside the home:  http://mobilealertsystems.com/ 1-800-800-1416 http://www.lifelinesys.com/content/lifeline-products/get-life-gosafe  1-800-380-3111 www.verizonwireless.com/sure/ 1-800-526-3178  Medial Alert systems that link to smart phones:  http://www.lifelinesys.com/content/lifeline-products/response-app http://www.greatcall.com/medical-apps/Fivestar http://www.lifealert.com/EmergencyMobileApp.aspx  [employer] GPS Tracker:  http://www.alz.org/comfortzone/ 1.877.259.4850  

## 2013-04-18 NOTE — Progress Notes (Signed)
Julie Fritz April 09, 1930 [id number]        80 y.o.  G0P0 for followup exam.  Several issues and below  Past medical history,surgical history, problem list, medications, allergies, family history and social history were all reviewed and documented in the EPIC chart.  ROS:  Performed and pertinent positives and negatives are included in the history, assessment and plan .  Exam: Kim assistant Filed Vitals:   04/18/13 1407  BP: 120/76  Height: 4\' 8"  (1.422 m)  Weight: 135 lb (61.236 kg)   General appearance  Normal Skin grossly normal Head/Neck normal with no cervical or supraclavicular adenopathy thyroid normal Lungs  clear Cardiac RR, without RMG Abdominal  soft, nontender, without masses, organomegaly or hernia Breasts  examined lying and sitting without masses, retractions, discharge or axillary adenopathy. Pelvic  Ext/BUS/vagina  generalized atrophic changes  Adnexa  Without masses or tenderness    Anus and perineum  Normal   Rectovaginal  Normal sphincter tone without palpated masses or tenderness.    Assessment/Plan:  79 y.o. G0P0 female for followup exam.   1. Postmenopausal/atrophic genital changes. History of TAH in the past. Without significant hot flushes, night sweats, vaginal dryness. Is not sexually active. Will continue to monitor. 2. Osteoporosis. DEXA 2013 with T score -2.7. Head fall with pelvic fracture last year. Was started on Prolia by Dr. Jenny Reichmann. She is unsure when her last shot was the we will contact his office to try to figure this out. I stressed the need to stay on Prolia for now given her osteoporosis both on paper and with fracture. Patient's actually seen Dr. Jenny Reichmann tomorrow have asked her to make sure she speaks with him about this. 3. History of dark urine last year. No history of this now. Check urinalysis. 4. Pap smear 2009. No Pap smear done today. No history of significant abnormal Pap smears previously. Reviewed current screening guidelines. Age 65  status post TAH for benign indications patient's comfortable. Screening. 5. Colonoscopy 2009. Repeat at their recommended interval. 6. Mammography 01/2012. Patient knows she is overdue and the need to schedule this. SBE monthly reviewed. 7. Health maintenance. No blood work done today. Reports having this done through her primary physician's office. Followup one year, sooner as needed.   Note: This document was prepared with digital dictation and possible smart phrase technology. Any transcriptional errors that result from this process are unintentional.   Anastasio Auerbach MD, 3:28 PM 04/18/2013

## 2013-04-19 ENCOUNTER — Telehealth: Payer: Self-pay | Admitting: *Deleted

## 2013-04-19 LAB — URINALYSIS W MICROSCOPIC + REFLEX CULTURE
BACTERIA UA: NONE SEEN
Bilirubin Urine: NEGATIVE
CASTS: NONE SEEN
Crystals: NONE SEEN
GLUCOSE, UA: NEGATIVE mg/dL
HGB URINE DIPSTICK: NEGATIVE
KETONES UR: NEGATIVE mg/dL
Nitrite: NEGATIVE
PH: 5.5 (ref 5.0–8.0)
Protein, ur: NEGATIVE mg/dL
Specific Gravity, Urine: 1.017 (ref 1.005–1.030)
Squamous Epithelial / LPF: NONE SEEN
Urobilinogen, UA: 0.2 mg/dL (ref 0.0–1.0)

## 2013-04-19 LAB — URINE CULTURE
COLONY COUNT: NO GROWTH
Organism ID, Bacteria: NO GROWTH

## 2013-04-19 NOTE — Telephone Encounter (Signed)
Left message for pt to call that her last injection was on 08/27/12 with PCP

## 2013-04-19 NOTE — Telephone Encounter (Signed)
Message copied by Thamas Jaegers on Tue Apr 19, 2013 10:11 AM ------      Message from: Anastasio Auerbach      Created: Mon Apr 18, 2013  3:32 PM       Check to see when her last Prolia injection was. Check both in our office and Dr. Jeneen Rinks John's office. If she is due/overdue she needs to arrange to have this done either here or at his office. ------

## 2013-05-10 ENCOUNTER — Ambulatory Visit: Payer: Medicare Other

## 2013-05-11 ENCOUNTER — Ambulatory Visit: Payer: Medicare Other | Admitting: Internal Medicine

## 2013-05-11 ENCOUNTER — Ambulatory Visit (INDEPENDENT_AMBULATORY_CARE_PROVIDER_SITE_OTHER): Payer: Medicare Other

## 2013-05-11 DIAGNOSIS — M810 Age-related osteoporosis without current pathological fracture: Secondary | ICD-10-CM

## 2013-05-11 MED ORDER — DENOSUMAB 60 MG/ML ~~LOC~~ SOLN: 60.0000 mg | Freq: Once | SUBCUTANEOUS | Status: AC

## 2013-07-29 ENCOUNTER — Other Ambulatory Visit: Payer: Self-pay | Admitting: Internal Medicine

## 2013-08-30 ENCOUNTER — Ambulatory Visit (INDEPENDENT_AMBULATORY_CARE_PROVIDER_SITE_OTHER): Payer: Medicare Other | Admitting: Internal Medicine

## 2013-08-30 ENCOUNTER — Telehealth: Payer: Self-pay

## 2013-08-30 ENCOUNTER — Telehealth: Payer: Self-pay | Admitting: Internal Medicine

## 2013-08-30 ENCOUNTER — Encounter: Payer: Self-pay | Admitting: Internal Medicine

## 2013-08-30 VITALS — BP 110/80 | HR 75 | Temp 98.1°F | Wt 132.0 lb

## 2013-08-30 DIAGNOSIS — K219 Gastro-esophageal reflux disease without esophagitis: Secondary | ICD-10-CM

## 2013-08-30 DIAGNOSIS — I10 Essential (primary) hypertension: Secondary | ICD-10-CM

## 2013-08-30 DIAGNOSIS — Z0000 Encounter for general adult medical examination without abnormal findings: Secondary | ICD-10-CM

## 2013-08-30 DIAGNOSIS — F411 Generalized anxiety disorder: Secondary | ICD-10-CM

## 2013-08-30 NOTE — Patient Instructions (Signed)
OK to take the prevacid twice per day for 2 wks, then call if not improved  Please continue all other medications as before, and refills have been done if requested.  Please have the pharmacy call with any other refills you may need.  Please continue your efforts at being more active, low cholesterol diet, and weight control.  Please keep your appointments with your specialists as you may have planned  Please return in 6 months, or sooner if needed

## 2013-08-30 NOTE — Telephone Encounter (Signed)
I have went ahead and sent pt's insurance info to [employer] for verification. If it is too early, then I will resend the first of July.  I will let you know the response from them either way as soon as I have one. Thank you.

## 2013-08-30 NOTE — Telephone Encounter (Signed)
This patient had her last Prolia on 05/11/2013.  She was in the office today for an appointment.  They would like her next Prolia to be scheduled on October 31, 2013.  I did advise that I would send request to [facility] who now does Prolia, but may be too soon to do insurance check for an August injection.

## 2013-08-30 NOTE — Progress Notes (Signed)
Subjective:    Patient ID: Julie Fritz, female    DOB: June 15, 1930, 80 y.o.   MRN: [id number]  HPI  Here to f/u, states swallowing slightly difficult with solids, ongoing for 1-2 wks, no cough, fever, ST, tongue sweling, sinus symptoms worsening, Denies worsening reflux, abd pain, wt loss,  n/v, bowel change or blood except for right side abd pain with bending at the waist toward the right.  Some increased belching as well.  Pt denies chest pain, increased sob or doe, wheezing, orthopnea, PND, increased LE swelling, palpitations, dizziness or syncope.  Denies worsening depressive symptoms, suicidal ideation, or panic; has ongoing anxiety Past Medical History  Diagnosis Date  . FHx: migraine headaches   . ALLERGIC RHINITIS   . Hx of colonic polyps     hx of adenoma last 7/09  . Hyperlipidemia   . Osteoarthritis   . Peptic ulcer disease   . GERD (gastroesophageal reflux disease)   . Left knee DJD   . Right knee DJD   . Ileocecal valve syndrome   . IBS (irritable bowel syndrome)   . Anxiety   . Depression   . Lumbar spondylosis   . ALLERGIC RHINITIS 12/03/2006  . ANXIETY 05/31/2008  . CARPAL TUNNEL SYNDROME, BILATERAL 06/03/2007  . COLONIC POLYPS, HX OF 12/03/2006  . DEPRESSION 05/31/2008  . GERD 03/16/2007  . HYPERLIPIDEMIA 12/03/2006  . Irritable bowel syndrome 09/15/2007  . MIGRAINE HEADACHE 12/03/2006  . OSTEOARTHRITIS 12/03/2006  . PEPTIC ULCER DISEASE 12/03/2006  . Vitamin D deficiency 06/11/2010  . Cervical radicular pain 07/18/2010  . Hypertension   . HYPERTENSION 12/03/2006  . Hypothyroidism   . HYPOTHYROIDISM 12/03/2006  . Sinusitis   . OSTEOPOROSIS 01/2012    t score -2.7  Prolia 08/2011  . Insomnia 04/23/2011   Past Surgical History  Procedure Laterality Date  . Thyroidectomy      02/2004  . Tonsillectomy      1937  . Back surgery  1990    LUMBAR LAMINECTOMY  . Right hand surgery trigger surgury    . S/p right hand surgury/trigger surgury      Dr. Myerdierck  .  Appendectomy      1979  . Abdominal hysterectomy      TAH  . Breast biopsy      1970    reports that she quit smoking about 9 years ago. Her smoking use included Cigarettes. She smoked 0.00 packs per day. She has quit using smokeless tobacco. She reports that she does not drink alcohol. Her drug history is not on file. family history includes Alzheimer's disease in her mother; Dementia in her mother; Hypertension in her mother; Leukemia in her sister. Allergies  Allergen Reactions  . Tetracycline     "Lightheaded."  . Tramadol     Dizzy, GI upset   Current Outpatient Prescriptions on File Prior to Visit  Medication Sig Dispense Refill  . atorvastatin (LIPITOR) 10 MG tablet Take 1 tablet (10 mg total) by mouth daily.  30 tablet  11  . calcitRIOL (ROCALTROL) 0.25 MCG capsule TAKE 1 CAPSULE (0.25 MCG TOTAL) BY MOUTH DAILY.  90 capsule  3  . Cholecalciferol (VITAMIN D3) 50000 UNITS CAPS Take 1 capsule by mouth once a week. For 2 months  8 capsule  0  . citalopram (CELEXA) 10 MG tablet Take 1 tablet (10 mg total) by mouth daily.  30 tablet  11  . donepezil (ARICEPT) 5 MG tablet Take 1 tablet (5 mg total) by  mouth daily.  30 tablet  11  . lansoprazole (PREVACID) 30 MG capsule Take 1 capsule (30 mg total) by mouth daily.  30 capsule  11  . levothyroxine (SYNTHROID, LEVOTHROID) 88 MCG tablet Take 1 tablet (88 mcg total) by mouth daily.  30 tablet  11  . lisinopril (PRINIVIL,ZESTRIL) 5 MG tablet Take 1 tablet (5 mg total) by mouth daily.  30 tablet  11  . fluticasone (FLONASE) 50 MCG/ACT nasal spray Place 2 sprays into the nose daily.  16 g  2   Current Facility-Administered Medications on File Prior to Visit  Medication Dose Route Frequency Provider Last Rate Last Dose  . denosumab (PROLIA) injection 60 mg  60 mg Subcutaneous Once Biagio Borg, MD       Review of Systems All otherwise neg per pt     Objective:   Physical Exam BP 110/80  Pulse 75  Temp(Src) 98.1 F (36.7 C) (Oral)   Wt 132 lb (59.875 kg)  SpO2 96% VS noted,  Constitutional: Pt appears well-developed, well-nourished.  HENT: Head: NCAT.  Right Ear: External ear normal.  Left Ear: External ear normal.  Eyes: . Pupils are equal, round, and reactive to light. Conjunctivae and EOM are normal Neck: Normal range of motion. Neck supple.  Cardiovascular: Normal rate and regular rhythm.   Pulmonary/Chest: Effort normal and breath sounds normal.  Abd:  Soft, NT, ND, + BS Neurological: Pt is alert. Not confused , motor grossly intact Skin: Skin is warm. No rash, no Edema Psychiatric: Pt behavior is normal. No agitation. 1+ nervous    Assessment & Plan:

## 2013-08-30 NOTE — Telephone Encounter (Signed)
Message copied by Biagio Borg on Tue Aug 30, 2013  7:04 PM ------      Message from: Sharon Seller B      Created: Tue Aug 30, 2013 11:47 AM       If the patient if due for her mammogram she would like one to be scheduled. ------

## 2013-08-30 NOTE — Telephone Encounter (Signed)
Order done

## 2013-09-05 NOTE — Assessment & Plan Note (Signed)
stable overall by history and exam, recent data reviewed with pt, and pt to continue medical treatment as before,  to f/u any worsening symptoms or concerns BP Readings from Last 3 Encounters:  08/30/13 110/80  04/18/13 120/76  04/18/13 110/68

## 2013-09-05 NOTE — Assessment & Plan Note (Signed)
Declines further tx at this time,  to f/u any worsening symptoms or concerns

## 2013-09-05 NOTE — Assessment & Plan Note (Signed)
Exam benign, husband and pt together declines further evaluation, to increased PPI to bid for 2 wks,  to f/u any worsening symptoms or concerns, consider GI referral

## 2013-09-07 ENCOUNTER — Telehealth: Payer: Self-pay | Admitting: Internal Medicine

## 2013-09-07 NOTE — Telephone Encounter (Signed)
I have received patient's insurance verification for her Prolia injection.  Patient's estimated responsibility will be as follows: with an office visit $40 co-pay PLUS 20% co-insurance which will be approximately $210; without an office visit she will only pay the 20% co-insurance which will be approximately $180.  I have sent this info to be scanned into patient's chart. Please make her aware this is only an estimate. Also, please be aware, insurance requires the injections to be 6 months and 1 day apart, so she's not eligible for her next injection until 11/09/2013 or after. If you have any questions please let me know. Thank you.

## 2013-09-07 NOTE — Telephone Encounter (Signed)
Called the patient on cell number left message to call back.  Home number has been disconnected.

## 2013-09-07 NOTE — Telephone Encounter (Signed)
Called the patients husband informed of Prolia information.  The husband did understand information.  Stated they would call back when closer to November 09, 2013 to schedule nurse visit for Prolia.

## 2013-11-09 ENCOUNTER — Ambulatory Visit (INDEPENDENT_AMBULATORY_CARE_PROVIDER_SITE_OTHER): Payer: Medicare Other

## 2013-11-09 DIAGNOSIS — M1990 Unspecified osteoarthritis, unspecified site: Secondary | ICD-10-CM

## 2013-11-09 MED ORDER — DENOSUMAB 60 MG/ML ~~LOC~~ SOLN
60.0000 mg | Freq: Once | SUBCUTANEOUS | Status: AC
  Administered 2013-11-09: 60 mg via SUBCUTANEOUS

## 2013-11-21 ENCOUNTER — Other Ambulatory Visit: Payer: Self-pay | Admitting: Internal Medicine

## 2013-11-22 ENCOUNTER — Encounter: Payer: Self-pay | Admitting: Internal Medicine

## 2013-12-08 ENCOUNTER — Other Ambulatory Visit: Payer: Self-pay | Admitting: Internal Medicine

## 2014-03-15 ENCOUNTER — Other Ambulatory Visit: Payer: Self-pay | Admitting: Internal Medicine

## 2014-05-13 IMAGING — CR DG HIP W/ PELVIS BILAT
5 series · 5 of 5 positions shown · non-contrast
Comparison: CT 09/15/2011

CLINICAL DATA: Follow-up fractures

BILATERAL HIP WITH PELVIS - 4+ VIEW

[view not recorded (1 of 5)]
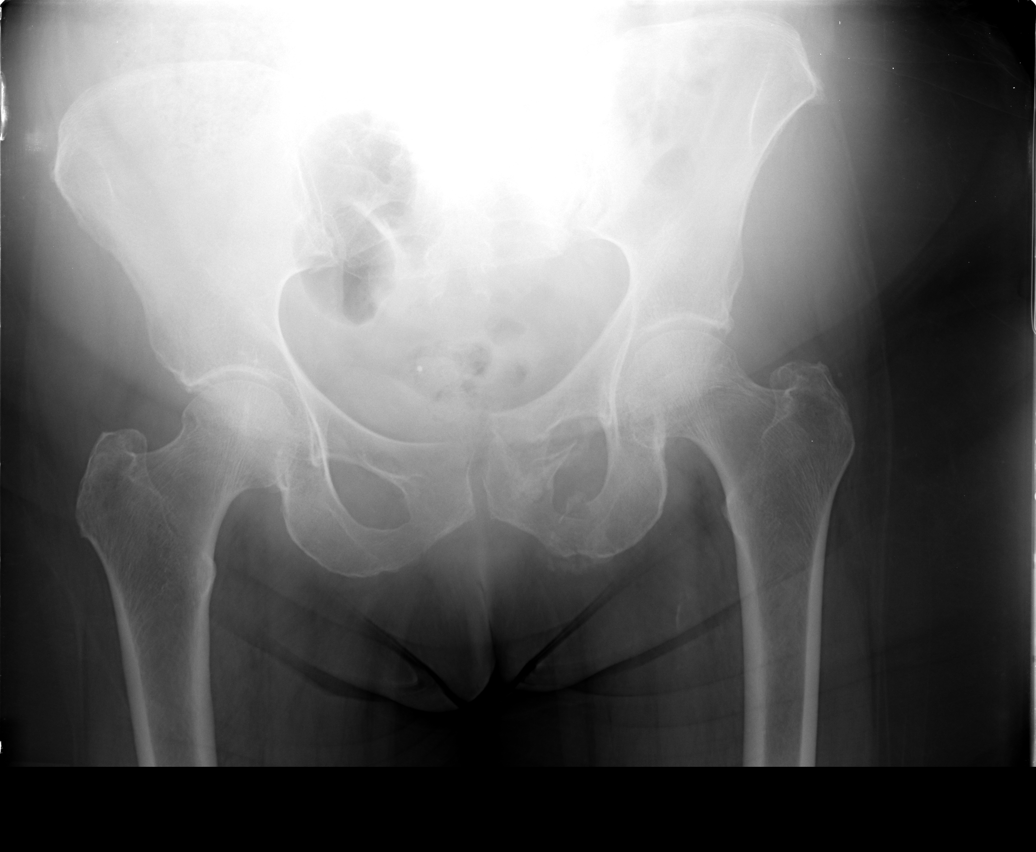

[view not recorded (2 of 5)]
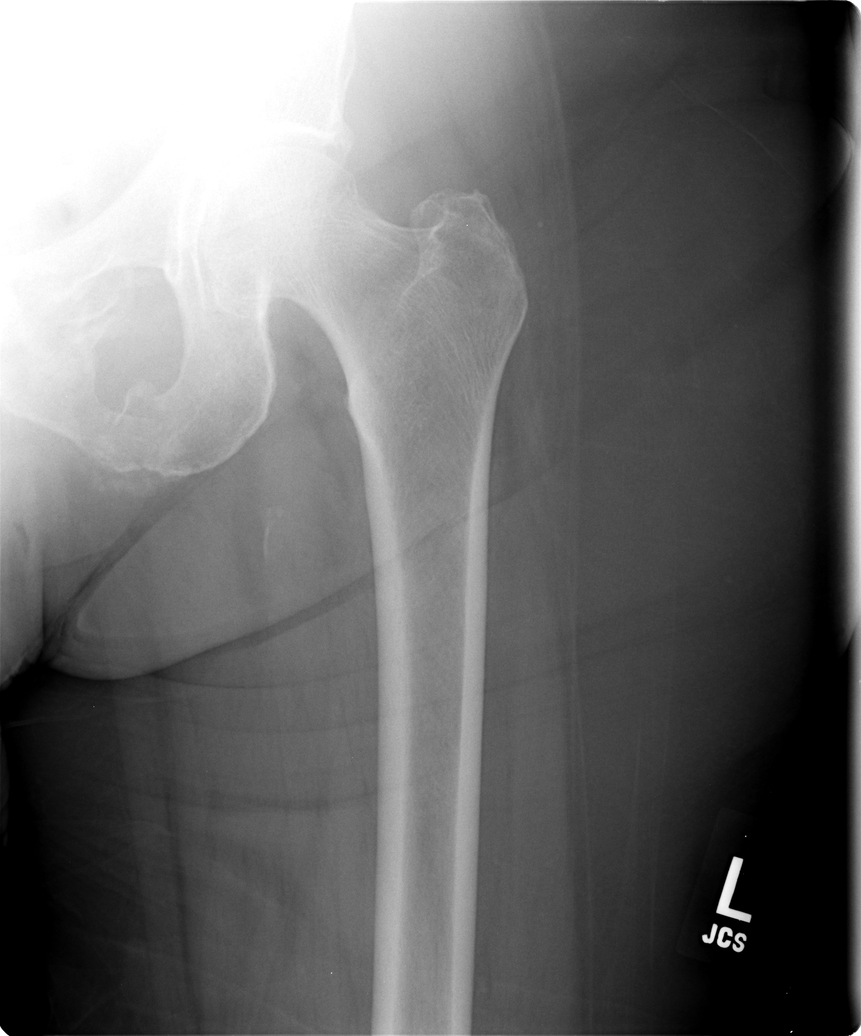

[view not recorded (3 of 5)]
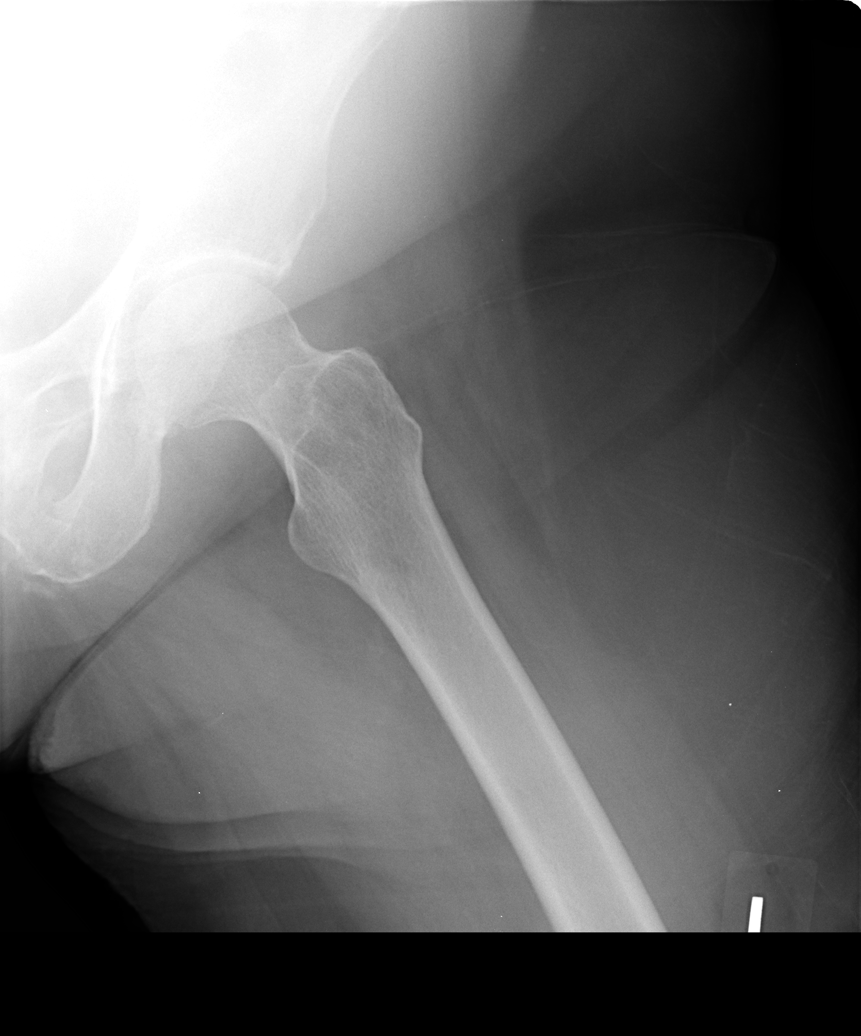

[view not recorded (4 of 5)]
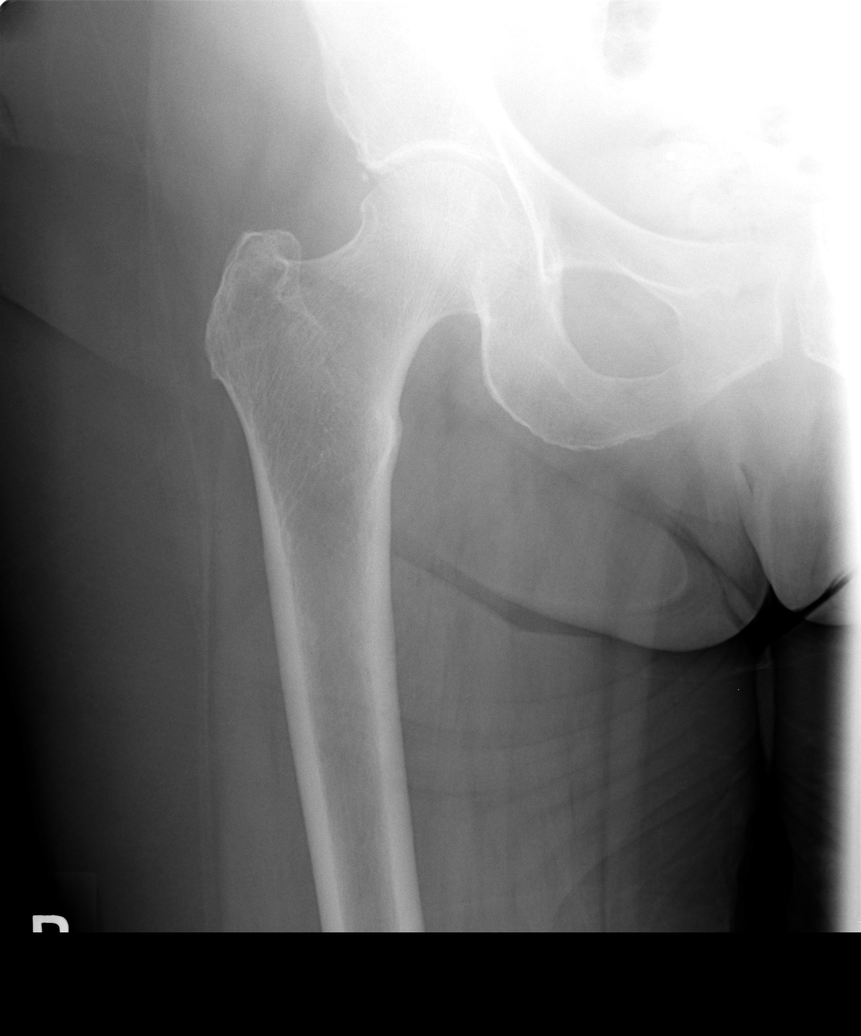

[view not recorded (5 of 5)]
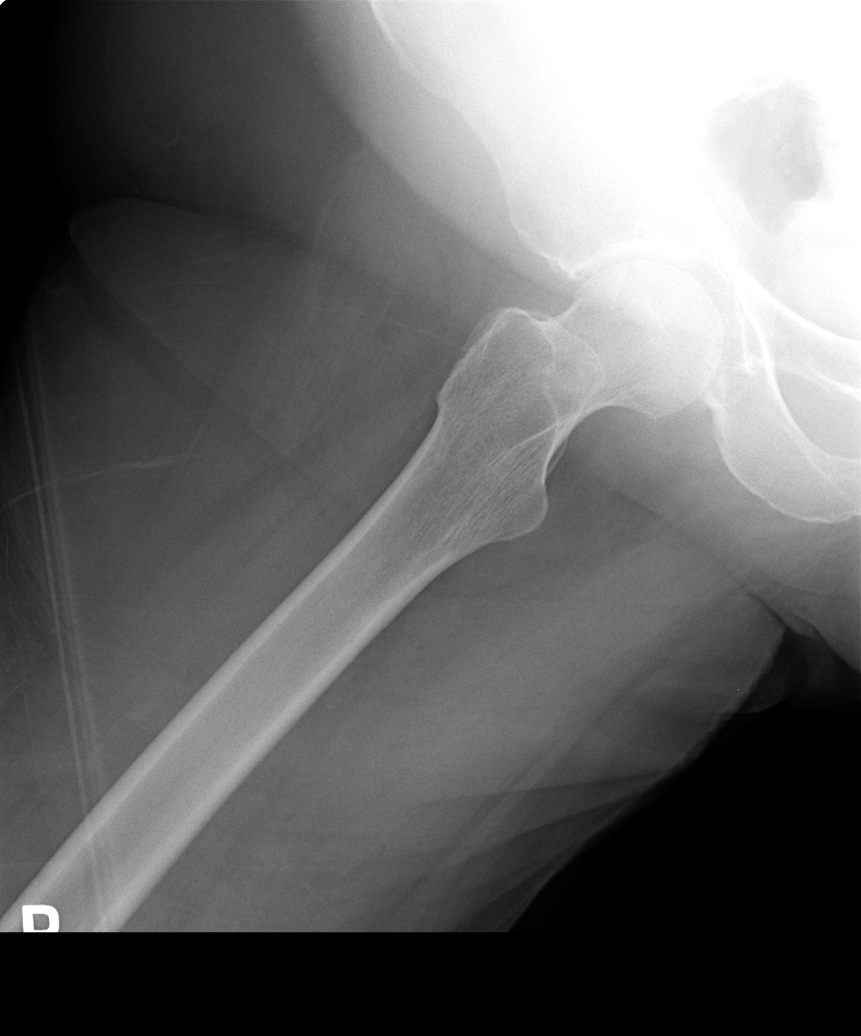

[5 of 5 positions shown; findings below may reference images not displayed]

FINDINGS: Both hips appear normal without evidence of destruction
of the hip joint or femur fracture.

There are healing fractures of the left inferior ramus and superior
ramus/symphysial region.  Sacral fractures cannot be appreciated
given the resolution of these films.  Mild displacement at the left
superior ramus/symphysial fracture site again noted.
IMPRESSION: No change in position or alignment since 09/15/2011.

## 2014-06-07 ENCOUNTER — Telehealth: Payer: Self-pay

## 2014-06-07 NOTE — Telephone Encounter (Signed)
Phone disconnected, no other valid phone number

## 2014-07-17 ENCOUNTER — Encounter: Payer: Self-pay | Admitting: Gastroenterology

## 2015-07-02 ENCOUNTER — Telehealth: Payer: Self-pay

## 2015-07-02 NOTE — Telephone Encounter (Signed)
Yes please, if ok with pt

## 2015-07-02 NOTE — Telephone Encounter (Signed)
Patient has not received prolia injection lately---do you want patient to continue with injections---if yes,

## 2015-07-03 NOTE — Telephone Encounter (Signed)
i tried to contact patient x3 at this phone number, phone will ring a few times and just disconnect, i will continue trying to reach patient to see if she wants to continue with prolia injections, if yes, we need to reverify her insurance

## 2015-07-03 NOTE — Telephone Encounter (Signed)
Tried to reach patient again and phone will ring a few times and then disconnect

## 2016-05-22 DEATH — deceased
